# Patient Record
Sex: Female | Born: 1965 | Race: White | Hispanic: No | Marital: Married | Smoking: Never smoker
Health system: Southern US, Community
[De-identification: ages and names within clinical notes are randomized; demographics above are authoritative.]

---

## 2015-07-01 DIAGNOSIS — G4733 Obstructive sleep apnea (adult) (pediatric): Secondary | ICD-10-CM | POA: Diagnosis not present

## 2015-07-09 DIAGNOSIS — G4733 Obstructive sleep apnea (adult) (pediatric): Secondary | ICD-10-CM | POA: Diagnosis not present

## 2015-07-14 DIAGNOSIS — G4700 Insomnia, unspecified: Secondary | ICD-10-CM | POA: Diagnosis not present

## 2015-07-24 DIAGNOSIS — E785 Hyperlipidemia, unspecified: Secondary | ICD-10-CM | POA: Diagnosis not present

## 2015-07-24 DIAGNOSIS — G4730 Sleep apnea, unspecified: Secondary | ICD-10-CM | POA: Diagnosis not present

## 2015-07-31 DIAGNOSIS — G4733 Obstructive sleep apnea (adult) (pediatric): Secondary | ICD-10-CM | POA: Diagnosis not present

## 2015-08-19 DIAGNOSIS — J309 Allergic rhinitis, unspecified: Secondary | ICD-10-CM | POA: Diagnosis not present

## 2015-08-19 DIAGNOSIS — R590 Localized enlarged lymph nodes: Secondary | ICD-10-CM | POA: Diagnosis not present

## 2015-08-19 DIAGNOSIS — R5383 Other fatigue: Secondary | ICD-10-CM | POA: Diagnosis not present

## 2015-08-31 DIAGNOSIS — G4733 Obstructive sleep apnea (adult) (pediatric): Secondary | ICD-10-CM | POA: Diagnosis not present

## 2015-09-01 DIAGNOSIS — G4719 Other hypersomnia: Secondary | ICD-10-CM | POA: Diagnosis not present

## 2015-09-01 DIAGNOSIS — R0981 Nasal congestion: Secondary | ICD-10-CM | POA: Diagnosis not present

## 2015-09-01 DIAGNOSIS — J342 Deviated nasal septum: Secondary | ICD-10-CM | POA: Diagnosis not present

## 2015-09-01 DIAGNOSIS — G4733 Obstructive sleep apnea (adult) (pediatric): Secondary | ICD-10-CM | POA: Diagnosis not present

## 2015-09-02 DIAGNOSIS — F4323 Adjustment disorder with mixed anxiety and depressed mood: Secondary | ICD-10-CM | POA: Diagnosis not present

## 2015-09-02 DIAGNOSIS — Z79899 Other long term (current) drug therapy: Secondary | ICD-10-CM | POA: Diagnosis not present

## 2015-09-02 DIAGNOSIS — F322 Major depressive disorder, single episode, severe without psychotic features: Secondary | ICD-10-CM | POA: Diagnosis not present

## 2015-09-09 DIAGNOSIS — M722 Plantar fascial fibromatosis: Secondary | ICD-10-CM | POA: Diagnosis not present

## 2015-09-09 DIAGNOSIS — M79671 Pain in right foot: Secondary | ICD-10-CM | POA: Diagnosis not present

## 2015-09-30 DIAGNOSIS — G4733 Obstructive sleep apnea (adult) (pediatric): Secondary | ICD-10-CM | POA: Diagnosis not present

## 2015-10-01 DIAGNOSIS — M79675 Pain in left toe(s): Secondary | ICD-10-CM | POA: Diagnosis not present

## 2015-10-01 DIAGNOSIS — L03032 Cellulitis of left toe: Secondary | ICD-10-CM | POA: Diagnosis not present

## 2015-10-01 DIAGNOSIS — L03031 Cellulitis of right toe: Secondary | ICD-10-CM | POA: Diagnosis not present

## 2015-10-01 DIAGNOSIS — M722 Plantar fascial fibromatosis: Secondary | ICD-10-CM | POA: Diagnosis not present

## 2015-10-07 DIAGNOSIS — H33051 Total retinal detachment, right eye: Secondary | ICD-10-CM | POA: Diagnosis not present

## 2015-10-07 DIAGNOSIS — H35371 Puckering of macula, right eye: Secondary | ICD-10-CM | POA: Diagnosis not present

## 2015-10-07 DIAGNOSIS — H3581 Retinal edema: Secondary | ICD-10-CM | POA: Diagnosis not present

## 2015-10-08 DIAGNOSIS — L03032 Cellulitis of left toe: Secondary | ICD-10-CM | POA: Diagnosis not present

## 2015-10-08 DIAGNOSIS — S9001XA Contusion of right ankle, initial encounter: Secondary | ICD-10-CM | POA: Diagnosis not present

## 2015-10-08 DIAGNOSIS — M722 Plantar fascial fibromatosis: Secondary | ICD-10-CM | POA: Diagnosis not present

## 2015-10-08 DIAGNOSIS — L03031 Cellulitis of right toe: Secondary | ICD-10-CM | POA: Diagnosis not present

## 2015-10-13 DIAGNOSIS — G4733 Obstructive sleep apnea (adult) (pediatric): Secondary | ICD-10-CM | POA: Diagnosis not present

## 2015-12-03 DIAGNOSIS — G4733 Obstructive sleep apnea (adult) (pediatric): Secondary | ICD-10-CM | POA: Diagnosis not present

## 2015-12-18 DIAGNOSIS — F4323 Adjustment disorder with mixed anxiety and depressed mood: Secondary | ICD-10-CM | POA: Diagnosis not present

## 2015-12-25 DIAGNOSIS — C50912 Malignant neoplasm of unspecified site of left female breast: Secondary | ICD-10-CM | POA: Diagnosis not present

## 2015-12-25 DIAGNOSIS — Z170 Estrogen receptor positive status [ER+]: Secondary | ICD-10-CM | POA: Diagnosis not present

## 2015-12-25 DIAGNOSIS — R232 Flushing: Secondary | ICD-10-CM | POA: Diagnosis not present

## 2015-12-25 DIAGNOSIS — Z803 Family history of malignant neoplasm of breast: Secondary | ICD-10-CM | POA: Diagnosis not present

## 2015-12-25 DIAGNOSIS — Z79811 Long term (current) use of aromatase inhibitors: Secondary | ICD-10-CM | POA: Diagnosis not present

## 2016-01-06 DIAGNOSIS — F4323 Adjustment disorder with mixed anxiety and depressed mood: Secondary | ICD-10-CM | POA: Diagnosis not present

## 2016-01-15 DIAGNOSIS — F3342 Major depressive disorder, recurrent, in full remission: Secondary | ICD-10-CM | POA: Diagnosis not present

## 2016-01-20 DIAGNOSIS — Z23 Encounter for immunization: Secondary | ICD-10-CM | POA: Diagnosis not present

## 2016-01-29 DIAGNOSIS — F3342 Major depressive disorder, recurrent, in full remission: Secondary | ICD-10-CM | POA: Diagnosis not present

## 2016-03-11 DIAGNOSIS — G4733 Obstructive sleep apnea (adult) (pediatric): Secondary | ICD-10-CM | POA: Diagnosis not present

## 2016-03-19 DIAGNOSIS — Z1151 Encounter for screening for human papillomavirus (HPV): Secondary | ICD-10-CM | POA: Diagnosis not present

## 2016-03-19 DIAGNOSIS — Z124 Encounter for screening for malignant neoplasm of cervix: Secondary | ICD-10-CM | POA: Diagnosis not present

## 2016-03-19 DIAGNOSIS — Z01419 Encounter for gynecological examination (general) (routine) without abnormal findings: Secondary | ICD-10-CM | POA: Diagnosis not present

## 2016-03-26 DIAGNOSIS — L738 Other specified follicular disorders: Secondary | ICD-10-CM | POA: Diagnosis not present

## 2016-03-26 DIAGNOSIS — L700 Acne vulgaris: Secondary | ICD-10-CM | POA: Diagnosis not present

## 2016-03-26 DIAGNOSIS — I781 Nevus, non-neoplastic: Secondary | ICD-10-CM | POA: Diagnosis not present

## 2016-03-31 DIAGNOSIS — J4520 Mild intermittent asthma, uncomplicated: Secondary | ICD-10-CM | POA: Diagnosis not present

## 2016-03-31 DIAGNOSIS — F418 Other specified anxiety disorders: Secondary | ICD-10-CM | POA: Diagnosis not present

## 2016-03-31 DIAGNOSIS — C50911 Malignant neoplasm of unspecified site of right female breast: Secondary | ICD-10-CM | POA: Diagnosis not present

## 2016-03-31 DIAGNOSIS — E7800 Pure hypercholesterolemia, unspecified: Secondary | ICD-10-CM | POA: Diagnosis not present

## 2016-04-01 DIAGNOSIS — E7800 Pure hypercholesterolemia, unspecified: Secondary | ICD-10-CM | POA: Diagnosis not present

## 2016-04-01 DIAGNOSIS — F418 Other specified anxiety disorders: Secondary | ICD-10-CM | POA: Diagnosis not present

## 2016-04-21 DIAGNOSIS — N951 Menopausal and female climacteric states: Secondary | ICD-10-CM | POA: Diagnosis not present

## 2016-04-21 DIAGNOSIS — L989 Disorder of the skin and subcutaneous tissue, unspecified: Secondary | ICD-10-CM | POA: Diagnosis not present

## 2016-04-21 DIAGNOSIS — J3089 Other allergic rhinitis: Secondary | ICD-10-CM | POA: Diagnosis not present

## 2016-04-21 DIAGNOSIS — E7800 Pure hypercholesterolemia, unspecified: Secondary | ICD-10-CM | POA: Diagnosis not present

## 2016-04-22 DIAGNOSIS — L814 Other melanin hyperpigmentation: Secondary | ICD-10-CM | POA: Diagnosis not present

## 2016-04-22 DIAGNOSIS — L821 Other seborrheic keratosis: Secondary | ICD-10-CM | POA: Diagnosis not present

## 2016-04-22 DIAGNOSIS — D225 Melanocytic nevi of trunk: Secondary | ICD-10-CM | POA: Diagnosis not present

## 2016-04-22 DIAGNOSIS — D1801 Hemangioma of skin and subcutaneous tissue: Secondary | ICD-10-CM | POA: Diagnosis not present

## 2016-04-30 DIAGNOSIS — F3342 Major depressive disorder, recurrent, in full remission: Secondary | ICD-10-CM | POA: Diagnosis not present

## 2016-05-05 ENCOUNTER — Ambulatory Visit (INDEPENDENT_AMBULATORY_CARE_PROVIDER_SITE_OTHER): Payer: Self-pay | Admitting: *Deleted

## 2016-05-05 DIAGNOSIS — I8393 Asymptomatic varicose veins of bilateral lower extremities: Secondary | ICD-10-CM

## 2016-05-05 NOTE — Progress Notes (Signed)
   Cutaneous Laser:pulsed mode  810j/cm2 400 ms delay  13 ms Duration 0.5 spot  Total pulses: 643 Total energy 1.019  Total time::08  Tiny red cappillaries scattered over legs. Vessels too small for sclero. Tol well and good local rxn. Anticipate good results. Follow prn.

## 2016-05-12 DIAGNOSIS — H43812 Vitreous degeneration, left eye: Secondary | ICD-10-CM | POA: Diagnosis not present

## 2016-05-12 DIAGNOSIS — H31001 Unspecified chorioretinal scars, right eye: Secondary | ICD-10-CM | POA: Diagnosis not present

## 2016-05-12 DIAGNOSIS — H52203 Unspecified astigmatism, bilateral: Secondary | ICD-10-CM | POA: Diagnosis not present

## 2016-05-12 DIAGNOSIS — H26491 Other secondary cataract, right eye: Secondary | ICD-10-CM | POA: Diagnosis not present

## 2016-05-24 DIAGNOSIS — R05 Cough: Secondary | ICD-10-CM | POA: Diagnosis not present

## 2016-05-24 DIAGNOSIS — J3089 Other allergic rhinitis: Secondary | ICD-10-CM | POA: Diagnosis not present

## 2016-05-31 DIAGNOSIS — J0100 Acute maxillary sinusitis, unspecified: Secondary | ICD-10-CM | POA: Diagnosis not present

## 2016-06-03 DIAGNOSIS — H26491 Other secondary cataract, right eye: Secondary | ICD-10-CM | POA: Diagnosis not present

## 2016-06-07 DIAGNOSIS — R12 Heartburn: Secondary | ICD-10-CM | POA: Diagnosis not present

## 2016-06-07 DIAGNOSIS — N951 Menopausal and female climacteric states: Secondary | ICD-10-CM | POA: Diagnosis not present

## 2016-06-07 DIAGNOSIS — J0100 Acute maxillary sinusitis, unspecified: Secondary | ICD-10-CM | POA: Diagnosis not present

## 2016-06-28 DIAGNOSIS — E7800 Pure hypercholesterolemia, unspecified: Secondary | ICD-10-CM | POA: Diagnosis not present

## 2016-06-28 DIAGNOSIS — R1013 Epigastric pain: Secondary | ICD-10-CM | POA: Diagnosis not present

## 2016-06-28 DIAGNOSIS — Z0000 Encounter for general adult medical examination without abnormal findings: Secondary | ICD-10-CM | POA: Diagnosis not present

## 2016-06-28 DIAGNOSIS — J4520 Mild intermittent asthma, uncomplicated: Secondary | ICD-10-CM | POA: Diagnosis not present

## 2016-07-13 DIAGNOSIS — H35371 Puckering of macula, right eye: Secondary | ICD-10-CM | POA: Diagnosis not present

## 2016-07-13 DIAGNOSIS — H3581 Retinal edema: Secondary | ICD-10-CM | POA: Diagnosis not present

## 2016-07-22 ENCOUNTER — Encounter: Payer: Self-pay | Admitting: *Deleted

## 2016-08-04 ENCOUNTER — Ambulatory Visit: Payer: BLUE CROSS/BLUE SHIELD | Admitting: *Deleted

## 2016-08-04 DIAGNOSIS — I8393 Asymptomatic varicose veins of bilateral lower extremities: Secondary | ICD-10-CM

## 2016-08-04 NOTE — Progress Notes (Signed)
Retreated a few areas with the CLon R thigh that did not close. Good local rxn. This should work. Follow prn. Somers visit.

## 2016-08-05 ENCOUNTER — Encounter: Payer: Self-pay | Admitting: *Deleted

## 2016-09-15 DIAGNOSIS — F3342 Major depressive disorder, recurrent, in full remission: Secondary | ICD-10-CM | POA: Diagnosis not present

## 2016-10-19 DIAGNOSIS — N951 Menopausal and female climacteric states: Secondary | ICD-10-CM | POA: Diagnosis not present

## 2016-10-19 DIAGNOSIS — E7800 Pure hypercholesterolemia, unspecified: Secondary | ICD-10-CM | POA: Diagnosis not present

## 2016-10-25 DIAGNOSIS — G4733 Obstructive sleep apnea (adult) (pediatric): Secondary | ICD-10-CM | POA: Diagnosis not present

## 2016-12-16 DIAGNOSIS — Z90722 Acquired absence of ovaries, bilateral: Secondary | ICD-10-CM | POA: Diagnosis not present

## 2016-12-16 DIAGNOSIS — Z79811 Long term (current) use of aromatase inhibitors: Secondary | ICD-10-CM | POA: Diagnosis not present

## 2016-12-16 DIAGNOSIS — Z791 Long term (current) use of non-steroidal anti-inflammatories (NSAID): Secondary | ICD-10-CM | POA: Diagnosis not present

## 2016-12-16 DIAGNOSIS — Z7951 Long term (current) use of inhaled steroids: Secondary | ICD-10-CM | POA: Diagnosis not present

## 2016-12-16 DIAGNOSIS — Z23 Encounter for immunization: Secondary | ICD-10-CM | POA: Diagnosis not present

## 2016-12-16 DIAGNOSIS — C50919 Malignant neoplasm of unspecified site of unspecified female breast: Secondary | ICD-10-CM | POA: Diagnosis not present

## 2016-12-16 DIAGNOSIS — Z170 Estrogen receptor positive status [ER+]: Secondary | ICD-10-CM | POA: Diagnosis not present

## 2016-12-16 DIAGNOSIS — Z803 Family history of malignant neoplasm of breast: Secondary | ICD-10-CM | POA: Diagnosis not present

## 2016-12-16 DIAGNOSIS — Z9013 Acquired absence of bilateral breasts and nipples: Secondary | ICD-10-CM | POA: Diagnosis not present

## 2016-12-16 DIAGNOSIS — Z9882 Breast implant status: Secondary | ICD-10-CM | POA: Diagnosis not present

## 2016-12-16 DIAGNOSIS — Z79899 Other long term (current) drug therapy: Secondary | ICD-10-CM | POA: Diagnosis not present

## 2016-12-16 DIAGNOSIS — N951 Menopausal and female climacteric states: Secondary | ICD-10-CM | POA: Diagnosis not present

## 2016-12-16 DIAGNOSIS — C50912 Malignant neoplasm of unspecified site of left female breast: Secondary | ICD-10-CM | POA: Diagnosis not present

## 2016-12-16 DIAGNOSIS — Z79818 Long term (current) use of other agents affecting estrogen receptors and estrogen levels: Secondary | ICD-10-CM | POA: Diagnosis not present

## 2016-12-16 DIAGNOSIS — N898 Other specified noninflammatory disorders of vagina: Secondary | ICD-10-CM | POA: Diagnosis not present

## 2016-12-20 DIAGNOSIS — C50911 Malignant neoplasm of unspecified site of right female breast: Secondary | ICD-10-CM | POA: Diagnosis not present

## 2016-12-20 DIAGNOSIS — Z1211 Encounter for screening for malignant neoplasm of colon: Secondary | ICD-10-CM | POA: Diagnosis not present

## 2016-12-20 DIAGNOSIS — E2839 Other primary ovarian failure: Secondary | ICD-10-CM | POA: Diagnosis not present

## 2016-12-20 DIAGNOSIS — J3089 Other allergic rhinitis: Secondary | ICD-10-CM | POA: Diagnosis not present

## 2017-01-11 DIAGNOSIS — Z01818 Encounter for other preprocedural examination: Secondary | ICD-10-CM | POA: Diagnosis not present

## 2017-01-11 DIAGNOSIS — Z1211 Encounter for screening for malignant neoplasm of colon: Secondary | ICD-10-CM | POA: Diagnosis not present

## 2017-01-24 DIAGNOSIS — G4733 Obstructive sleep apnea (adult) (pediatric): Secondary | ICD-10-CM | POA: Diagnosis not present

## 2017-01-31 DIAGNOSIS — M8588 Other specified disorders of bone density and structure, other site: Secondary | ICD-10-CM | POA: Diagnosis not present

## 2017-01-31 DIAGNOSIS — E2839 Other primary ovarian failure: Secondary | ICD-10-CM | POA: Diagnosis not present

## 2017-02-07 DIAGNOSIS — Z1211 Encounter for screening for malignant neoplasm of colon: Secondary | ICD-10-CM | POA: Diagnosis not present

## 2017-02-07 DIAGNOSIS — D126 Benign neoplasm of colon, unspecified: Secondary | ICD-10-CM | POA: Diagnosis not present

## 2017-02-10 DIAGNOSIS — Z1211 Encounter for screening for malignant neoplasm of colon: Secondary | ICD-10-CM | POA: Diagnosis not present

## 2017-02-10 DIAGNOSIS — D126 Benign neoplasm of colon, unspecified: Secondary | ICD-10-CM | POA: Diagnosis not present

## 2017-03-03 DIAGNOSIS — F3342 Major depressive disorder, recurrent, in full remission: Secondary | ICD-10-CM | POA: Diagnosis not present

## 2017-04-01 DIAGNOSIS — R300 Dysuria: Secondary | ICD-10-CM | POA: Diagnosis not present

## 2017-04-01 DIAGNOSIS — N3000 Acute cystitis without hematuria: Secondary | ICD-10-CM | POA: Diagnosis not present

## 2017-04-27 DIAGNOSIS — N390 Urinary tract infection, site not specified: Secondary | ICD-10-CM | POA: Diagnosis not present

## 2017-05-23 DIAGNOSIS — G4733 Obstructive sleep apnea (adult) (pediatric): Secondary | ICD-10-CM | POA: Diagnosis not present

## 2017-06-08 DIAGNOSIS — H52203 Unspecified astigmatism, bilateral: Secondary | ICD-10-CM | POA: Diagnosis not present

## 2017-06-08 DIAGNOSIS — Z961 Presence of intraocular lens: Secondary | ICD-10-CM | POA: Diagnosis not present

## 2017-06-08 DIAGNOSIS — H35371 Puckering of macula, right eye: Secondary | ICD-10-CM | POA: Diagnosis not present

## 2017-06-08 DIAGNOSIS — H31001 Unspecified chorioretinal scars, right eye: Secondary | ICD-10-CM | POA: Diagnosis not present

## 2017-07-05 DIAGNOSIS — C50911 Malignant neoplasm of unspecified site of right female breast: Secondary | ICD-10-CM | POA: Diagnosis not present

## 2017-07-05 DIAGNOSIS — E7800 Pure hypercholesterolemia, unspecified: Secondary | ICD-10-CM | POA: Diagnosis not present

## 2017-07-05 DIAGNOSIS — Z0000 Encounter for general adult medical examination without abnormal findings: Secondary | ICD-10-CM | POA: Diagnosis not present

## 2017-07-05 DIAGNOSIS — F418 Other specified anxiety disorders: Secondary | ICD-10-CM | POA: Diagnosis not present

## 2017-07-05 DIAGNOSIS — Z23 Encounter for immunization: Secondary | ICD-10-CM | POA: Diagnosis not present

## 2017-08-01 DIAGNOSIS — J4520 Mild intermittent asthma, uncomplicated: Secondary | ICD-10-CM | POA: Diagnosis not present

## 2017-08-01 DIAGNOSIS — T781XXA Other adverse food reactions, not elsewhere classified, initial encounter: Secondary | ICD-10-CM | POA: Diagnosis not present

## 2017-08-01 DIAGNOSIS — J3089 Other allergic rhinitis: Secondary | ICD-10-CM | POA: Diagnosis not present

## 2017-08-01 DIAGNOSIS — J3081 Allergic rhinitis due to animal (cat) (dog) hair and dander: Secondary | ICD-10-CM | POA: Diagnosis not present

## 2017-08-04 DIAGNOSIS — R252 Cramp and spasm: Secondary | ICD-10-CM | POA: Diagnosis not present

## 2017-08-04 DIAGNOSIS — N898 Other specified noninflammatory disorders of vagina: Secondary | ICD-10-CM | POA: Diagnosis not present

## 2017-08-08 DIAGNOSIS — G4733 Obstructive sleep apnea (adult) (pediatric): Secondary | ICD-10-CM | POA: Diagnosis not present

## 2017-08-15 DIAGNOSIS — J3089 Other allergic rhinitis: Secondary | ICD-10-CM | POA: Diagnosis not present

## 2017-08-16 DIAGNOSIS — J3081 Allergic rhinitis due to animal (cat) (dog) hair and dander: Secondary | ICD-10-CM | POA: Diagnosis not present

## 2017-08-17 DIAGNOSIS — R300 Dysuria: Secondary | ICD-10-CM | POA: Diagnosis not present

## 2017-08-24 DIAGNOSIS — J3089 Other allergic rhinitis: Secondary | ICD-10-CM | POA: Diagnosis not present

## 2017-08-24 DIAGNOSIS — J301 Allergic rhinitis due to pollen: Secondary | ICD-10-CM | POA: Diagnosis not present

## 2017-08-24 DIAGNOSIS — J3081 Allergic rhinitis due to animal (cat) (dog) hair and dander: Secondary | ICD-10-CM | POA: Diagnosis not present

## 2017-08-26 DIAGNOSIS — J3081 Allergic rhinitis due to animal (cat) (dog) hair and dander: Secondary | ICD-10-CM | POA: Diagnosis not present

## 2017-08-26 DIAGNOSIS — J3089 Other allergic rhinitis: Secondary | ICD-10-CM | POA: Diagnosis not present

## 2017-08-29 DIAGNOSIS — J3089 Other allergic rhinitis: Secondary | ICD-10-CM | POA: Diagnosis not present

## 2017-08-29 DIAGNOSIS — J3081 Allergic rhinitis due to animal (cat) (dog) hair and dander: Secondary | ICD-10-CM | POA: Diagnosis not present

## 2017-08-30 DIAGNOSIS — F3342 Major depressive disorder, recurrent, in full remission: Secondary | ICD-10-CM | POA: Diagnosis not present

## 2017-08-31 DIAGNOSIS — J3089 Other allergic rhinitis: Secondary | ICD-10-CM | POA: Diagnosis not present

## 2017-08-31 DIAGNOSIS — J3081 Allergic rhinitis due to animal (cat) (dog) hair and dander: Secondary | ICD-10-CM | POA: Diagnosis not present

## 2017-09-07 DIAGNOSIS — J3089 Other allergic rhinitis: Secondary | ICD-10-CM | POA: Diagnosis not present

## 2017-09-07 DIAGNOSIS — J3081 Allergic rhinitis due to animal (cat) (dog) hair and dander: Secondary | ICD-10-CM | POA: Diagnosis not present

## 2017-09-07 DIAGNOSIS — J301 Allergic rhinitis due to pollen: Secondary | ICD-10-CM | POA: Diagnosis not present

## 2017-09-09 DIAGNOSIS — J3089 Other allergic rhinitis: Secondary | ICD-10-CM | POA: Diagnosis not present

## 2017-09-09 DIAGNOSIS — J3081 Allergic rhinitis due to animal (cat) (dog) hair and dander: Secondary | ICD-10-CM | POA: Diagnosis not present

## 2017-09-19 DIAGNOSIS — J3089 Other allergic rhinitis: Secondary | ICD-10-CM | POA: Diagnosis not present

## 2017-09-19 DIAGNOSIS — J3081 Allergic rhinitis due to animal (cat) (dog) hair and dander: Secondary | ICD-10-CM | POA: Diagnosis not present

## 2017-09-23 DIAGNOSIS — J3081 Allergic rhinitis due to animal (cat) (dog) hair and dander: Secondary | ICD-10-CM | POA: Diagnosis not present

## 2017-09-23 DIAGNOSIS — J3089 Other allergic rhinitis: Secondary | ICD-10-CM | POA: Diagnosis not present

## 2017-09-26 DIAGNOSIS — J301 Allergic rhinitis due to pollen: Secondary | ICD-10-CM | POA: Diagnosis not present

## 2017-09-26 DIAGNOSIS — J3081 Allergic rhinitis due to animal (cat) (dog) hair and dander: Secondary | ICD-10-CM | POA: Diagnosis not present

## 2017-09-26 DIAGNOSIS — J3089 Other allergic rhinitis: Secondary | ICD-10-CM | POA: Diagnosis not present

## 2017-09-28 DIAGNOSIS — J3089 Other allergic rhinitis: Secondary | ICD-10-CM | POA: Diagnosis not present

## 2017-09-28 DIAGNOSIS — J3081 Allergic rhinitis due to animal (cat) (dog) hair and dander: Secondary | ICD-10-CM | POA: Diagnosis not present

## 2017-10-04 DIAGNOSIS — J3081 Allergic rhinitis due to animal (cat) (dog) hair and dander: Secondary | ICD-10-CM | POA: Diagnosis not present

## 2017-10-04 DIAGNOSIS — J3089 Other allergic rhinitis: Secondary | ICD-10-CM | POA: Diagnosis not present

## 2017-10-07 DIAGNOSIS — J3089 Other allergic rhinitis: Secondary | ICD-10-CM | POA: Diagnosis not present

## 2017-10-07 DIAGNOSIS — J3081 Allergic rhinitis due to animal (cat) (dog) hair and dander: Secondary | ICD-10-CM | POA: Diagnosis not present

## 2017-10-12 DIAGNOSIS — J3081 Allergic rhinitis due to animal (cat) (dog) hair and dander: Secondary | ICD-10-CM | POA: Diagnosis not present

## 2017-10-12 DIAGNOSIS — J3089 Other allergic rhinitis: Secondary | ICD-10-CM | POA: Diagnosis not present

## 2017-10-14 DIAGNOSIS — J3089 Other allergic rhinitis: Secondary | ICD-10-CM | POA: Diagnosis not present

## 2017-10-14 DIAGNOSIS — J3081 Allergic rhinitis due to animal (cat) (dog) hair and dander: Secondary | ICD-10-CM | POA: Diagnosis not present

## 2017-10-19 DIAGNOSIS — J3089 Other allergic rhinitis: Secondary | ICD-10-CM | POA: Diagnosis not present

## 2017-10-19 DIAGNOSIS — J3081 Allergic rhinitis due to animal (cat) (dog) hair and dander: Secondary | ICD-10-CM | POA: Diagnosis not present

## 2017-10-21 DIAGNOSIS — J3089 Other allergic rhinitis: Secondary | ICD-10-CM | POA: Diagnosis not present

## 2017-10-21 DIAGNOSIS — J3081 Allergic rhinitis due to animal (cat) (dog) hair and dander: Secondary | ICD-10-CM | POA: Diagnosis not present

## 2017-10-21 DIAGNOSIS — J301 Allergic rhinitis due to pollen: Secondary | ICD-10-CM | POA: Diagnosis not present

## 2017-10-26 DIAGNOSIS — J3081 Allergic rhinitis due to animal (cat) (dog) hair and dander: Secondary | ICD-10-CM | POA: Diagnosis not present

## 2017-10-26 DIAGNOSIS — J3089 Other allergic rhinitis: Secondary | ICD-10-CM | POA: Diagnosis not present

## 2017-10-28 DIAGNOSIS — J3089 Other allergic rhinitis: Secondary | ICD-10-CM | POA: Diagnosis not present

## 2017-10-28 DIAGNOSIS — J3081 Allergic rhinitis due to animal (cat) (dog) hair and dander: Secondary | ICD-10-CM | POA: Diagnosis not present

## 2017-11-02 DIAGNOSIS — J3089 Other allergic rhinitis: Secondary | ICD-10-CM | POA: Diagnosis not present

## 2017-11-02 DIAGNOSIS — J3081 Allergic rhinitis due to animal (cat) (dog) hair and dander: Secondary | ICD-10-CM | POA: Diagnosis not present

## 2017-11-04 DIAGNOSIS — J3081 Allergic rhinitis due to animal (cat) (dog) hair and dander: Secondary | ICD-10-CM | POA: Diagnosis not present

## 2017-11-04 DIAGNOSIS — J3089 Other allergic rhinitis: Secondary | ICD-10-CM | POA: Diagnosis not present

## 2017-11-07 DIAGNOSIS — J3081 Allergic rhinitis due to animal (cat) (dog) hair and dander: Secondary | ICD-10-CM | POA: Diagnosis not present

## 2017-11-07 DIAGNOSIS — J301 Allergic rhinitis due to pollen: Secondary | ICD-10-CM | POA: Diagnosis not present

## 2017-11-07 DIAGNOSIS — G4733 Obstructive sleep apnea (adult) (pediatric): Secondary | ICD-10-CM | POA: Diagnosis not present

## 2017-11-10 DIAGNOSIS — R252 Cramp and spasm: Secondary | ICD-10-CM | POA: Diagnosis not present

## 2017-11-11 DIAGNOSIS — J3089 Other allergic rhinitis: Secondary | ICD-10-CM | POA: Diagnosis not present

## 2017-11-11 DIAGNOSIS — J3081 Allergic rhinitis due to animal (cat) (dog) hair and dander: Secondary | ICD-10-CM | POA: Diagnosis not present

## 2017-11-16 DIAGNOSIS — J3089 Other allergic rhinitis: Secondary | ICD-10-CM | POA: Diagnosis not present

## 2017-11-16 DIAGNOSIS — J3081 Allergic rhinitis due to animal (cat) (dog) hair and dander: Secondary | ICD-10-CM | POA: Diagnosis not present

## 2017-11-23 DIAGNOSIS — J3081 Allergic rhinitis due to animal (cat) (dog) hair and dander: Secondary | ICD-10-CM | POA: Diagnosis not present

## 2017-11-23 DIAGNOSIS — J3089 Other allergic rhinitis: Secondary | ICD-10-CM | POA: Diagnosis not present

## 2017-11-25 DIAGNOSIS — J3089 Other allergic rhinitis: Secondary | ICD-10-CM | POA: Diagnosis not present

## 2017-11-29 DIAGNOSIS — J3081 Allergic rhinitis due to animal (cat) (dog) hair and dander: Secondary | ICD-10-CM | POA: Diagnosis not present

## 2017-12-01 DIAGNOSIS — L738 Other specified follicular disorders: Secondary | ICD-10-CM | POA: Diagnosis not present

## 2017-12-01 DIAGNOSIS — J3089 Other allergic rhinitis: Secondary | ICD-10-CM | POA: Diagnosis not present

## 2017-12-01 DIAGNOSIS — J3081 Allergic rhinitis due to animal (cat) (dog) hair and dander: Secondary | ICD-10-CM | POA: Diagnosis not present

## 2017-12-01 DIAGNOSIS — L718 Other rosacea: Secondary | ICD-10-CM | POA: Diagnosis not present

## 2017-12-02 DIAGNOSIS — Z853 Personal history of malignant neoplasm of breast: Secondary | ICD-10-CM | POA: Diagnosis not present

## 2017-12-02 DIAGNOSIS — C50919 Malignant neoplasm of unspecified site of unspecified female breast: Secondary | ICD-10-CM | POA: Diagnosis not present

## 2017-12-02 DIAGNOSIS — Z803 Family history of malignant neoplasm of breast: Secondary | ICD-10-CM | POA: Diagnosis not present

## 2017-12-02 DIAGNOSIS — Z170 Estrogen receptor positive status [ER+]: Secondary | ICD-10-CM | POA: Diagnosis not present

## 2017-12-02 DIAGNOSIS — Z6825 Body mass index (BMI) 25.0-25.9, adult: Secondary | ICD-10-CM | POA: Diagnosis not present

## 2017-12-02 DIAGNOSIS — Z79811 Long term (current) use of aromatase inhibitors: Secondary | ICD-10-CM | POA: Diagnosis not present

## 2017-12-05 DIAGNOSIS — J3089 Other allergic rhinitis: Secondary | ICD-10-CM | POA: Diagnosis not present

## 2017-12-05 DIAGNOSIS — J3081 Allergic rhinitis due to animal (cat) (dog) hair and dander: Secondary | ICD-10-CM | POA: Diagnosis not present

## 2017-12-05 DIAGNOSIS — R252 Cramp and spasm: Secondary | ICD-10-CM | POA: Diagnosis not present

## 2017-12-14 DIAGNOSIS — J3089 Other allergic rhinitis: Secondary | ICD-10-CM | POA: Diagnosis not present

## 2017-12-14 DIAGNOSIS — J3081 Allergic rhinitis due to animal (cat) (dog) hair and dander: Secondary | ICD-10-CM | POA: Diagnosis not present

## 2017-12-14 DIAGNOSIS — J301 Allergic rhinitis due to pollen: Secondary | ICD-10-CM | POA: Diagnosis not present

## 2017-12-21 DIAGNOSIS — J3081 Allergic rhinitis due to animal (cat) (dog) hair and dander: Secondary | ICD-10-CM | POA: Diagnosis not present

## 2017-12-21 DIAGNOSIS — J3089 Other allergic rhinitis: Secondary | ICD-10-CM | POA: Diagnosis not present

## 2017-12-26 DIAGNOSIS — R252 Cramp and spasm: Secondary | ICD-10-CM | POA: Diagnosis not present

## 2017-12-28 DIAGNOSIS — J3089 Other allergic rhinitis: Secondary | ICD-10-CM | POA: Diagnosis not present

## 2017-12-28 DIAGNOSIS — J3081 Allergic rhinitis due to animal (cat) (dog) hair and dander: Secondary | ICD-10-CM | POA: Diagnosis not present

## 2018-01-04 DIAGNOSIS — J3081 Allergic rhinitis due to animal (cat) (dog) hair and dander: Secondary | ICD-10-CM | POA: Diagnosis not present

## 2018-01-04 DIAGNOSIS — R252 Cramp and spasm: Secondary | ICD-10-CM | POA: Diagnosis not present

## 2018-01-04 DIAGNOSIS — J3089 Other allergic rhinitis: Secondary | ICD-10-CM | POA: Diagnosis not present

## 2018-01-11 DIAGNOSIS — R252 Cramp and spasm: Secondary | ICD-10-CM | POA: Diagnosis not present

## 2018-01-11 DIAGNOSIS — J301 Allergic rhinitis due to pollen: Secondary | ICD-10-CM | POA: Diagnosis not present

## 2018-01-11 DIAGNOSIS — J3089 Other allergic rhinitis: Secondary | ICD-10-CM | POA: Diagnosis not present

## 2018-01-11 DIAGNOSIS — J3081 Allergic rhinitis due to animal (cat) (dog) hair and dander: Secondary | ICD-10-CM | POA: Diagnosis not present

## 2018-01-18 DIAGNOSIS — J3089 Other allergic rhinitis: Secondary | ICD-10-CM | POA: Diagnosis not present

## 2018-01-18 DIAGNOSIS — J3081 Allergic rhinitis due to animal (cat) (dog) hair and dander: Secondary | ICD-10-CM | POA: Diagnosis not present

## 2018-01-18 DIAGNOSIS — R252 Cramp and spasm: Secondary | ICD-10-CM | POA: Diagnosis not present

## 2018-01-20 DIAGNOSIS — G4733 Obstructive sleep apnea (adult) (pediatric): Secondary | ICD-10-CM | POA: Diagnosis not present

## 2018-01-27 DIAGNOSIS — J3089 Other allergic rhinitis: Secondary | ICD-10-CM | POA: Diagnosis not present

## 2018-01-27 DIAGNOSIS — J3081 Allergic rhinitis due to animal (cat) (dog) hair and dander: Secondary | ICD-10-CM | POA: Diagnosis not present

## 2018-01-30 DIAGNOSIS — R252 Cramp and spasm: Secondary | ICD-10-CM | POA: Diagnosis not present

## 2018-02-01 DIAGNOSIS — J3089 Other allergic rhinitis: Secondary | ICD-10-CM | POA: Diagnosis not present

## 2018-02-01 DIAGNOSIS — J3081 Allergic rhinitis due to animal (cat) (dog) hair and dander: Secondary | ICD-10-CM | POA: Diagnosis not present

## 2018-02-06 DIAGNOSIS — G4733 Obstructive sleep apnea (adult) (pediatric): Secondary | ICD-10-CM | POA: Diagnosis not present

## 2018-02-06 DIAGNOSIS — R252 Cramp and spasm: Secondary | ICD-10-CM | POA: Diagnosis not present

## 2018-02-08 DIAGNOSIS — Z170 Estrogen receptor positive status [ER+]: Secondary | ICD-10-CM | POA: Diagnosis not present

## 2018-02-08 DIAGNOSIS — Z01419 Encounter for gynecological examination (general) (routine) without abnormal findings: Secondary | ICD-10-CM | POA: Diagnosis not present

## 2018-02-08 DIAGNOSIS — J3081 Allergic rhinitis due to animal (cat) (dog) hair and dander: Secondary | ICD-10-CM | POA: Diagnosis not present

## 2018-02-08 DIAGNOSIS — J3089 Other allergic rhinitis: Secondary | ICD-10-CM | POA: Diagnosis not present

## 2018-02-08 DIAGNOSIS — C50912 Malignant neoplasm of unspecified site of left female breast: Secondary | ICD-10-CM | POA: Diagnosis not present

## 2018-02-13 DIAGNOSIS — J3089 Other allergic rhinitis: Secondary | ICD-10-CM | POA: Diagnosis not present

## 2018-02-13 DIAGNOSIS — J3081 Allergic rhinitis due to animal (cat) (dog) hair and dander: Secondary | ICD-10-CM | POA: Diagnosis not present

## 2018-02-13 DIAGNOSIS — J4520 Mild intermittent asthma, uncomplicated: Secondary | ICD-10-CM | POA: Diagnosis not present

## 2018-02-13 DIAGNOSIS — T781XXD Other adverse food reactions, not elsewhere classified, subsequent encounter: Secondary | ICD-10-CM | POA: Diagnosis not present

## 2018-02-14 DIAGNOSIS — R252 Cramp and spasm: Secondary | ICD-10-CM | POA: Diagnosis not present

## 2018-02-15 DIAGNOSIS — J3089 Other allergic rhinitis: Secondary | ICD-10-CM | POA: Diagnosis not present

## 2018-02-15 DIAGNOSIS — J3081 Allergic rhinitis due to animal (cat) (dog) hair and dander: Secondary | ICD-10-CM | POA: Diagnosis not present

## 2018-02-20 DIAGNOSIS — D485 Neoplasm of uncertain behavior of skin: Secondary | ICD-10-CM | POA: Diagnosis not present

## 2018-02-20 DIAGNOSIS — L718 Other rosacea: Secondary | ICD-10-CM | POA: Diagnosis not present

## 2018-02-21 DIAGNOSIS — J3081 Allergic rhinitis due to animal (cat) (dog) hair and dander: Secondary | ICD-10-CM | POA: Diagnosis not present

## 2018-02-21 DIAGNOSIS — J3089 Other allergic rhinitis: Secondary | ICD-10-CM | POA: Diagnosis not present

## 2018-02-27 DIAGNOSIS — F3342 Major depressive disorder, recurrent, in full remission: Secondary | ICD-10-CM | POA: Diagnosis not present

## 2018-02-28 DIAGNOSIS — J3081 Allergic rhinitis due to animal (cat) (dog) hair and dander: Secondary | ICD-10-CM | POA: Diagnosis not present

## 2018-02-28 DIAGNOSIS — J3089 Other allergic rhinitis: Secondary | ICD-10-CM | POA: Diagnosis not present

## 2018-03-07 DIAGNOSIS — B078 Other viral warts: Secondary | ICD-10-CM | POA: Diagnosis not present

## 2018-03-07 DIAGNOSIS — L718 Other rosacea: Secondary | ICD-10-CM | POA: Diagnosis not present

## 2018-03-10 DIAGNOSIS — J3089 Other allergic rhinitis: Secondary | ICD-10-CM | POA: Diagnosis not present

## 2018-03-10 DIAGNOSIS — J3081 Allergic rhinitis due to animal (cat) (dog) hair and dander: Secondary | ICD-10-CM | POA: Diagnosis not present

## 2018-03-14 DIAGNOSIS — R05 Cough: Secondary | ICD-10-CM | POA: Diagnosis not present

## 2018-03-14 DIAGNOSIS — J9801 Acute bronchospasm: Secondary | ICD-10-CM | POA: Diagnosis not present

## 2018-03-14 DIAGNOSIS — J40 Bronchitis, not specified as acute or chronic: Secondary | ICD-10-CM | POA: Diagnosis not present

## 2018-03-17 DIAGNOSIS — J3081 Allergic rhinitis due to animal (cat) (dog) hair and dander: Secondary | ICD-10-CM | POA: Diagnosis not present

## 2018-03-17 DIAGNOSIS — J3089 Other allergic rhinitis: Secondary | ICD-10-CM | POA: Diagnosis not present

## 2018-03-24 DIAGNOSIS — J3089 Other allergic rhinitis: Secondary | ICD-10-CM | POA: Diagnosis not present

## 2018-03-24 DIAGNOSIS — J3081 Allergic rhinitis due to animal (cat) (dog) hair and dander: Secondary | ICD-10-CM | POA: Diagnosis not present

## 2018-03-27 DIAGNOSIS — G4733 Obstructive sleep apnea (adult) (pediatric): Secondary | ICD-10-CM | POA: Diagnosis not present

## 2018-03-28 DIAGNOSIS — J3089 Other allergic rhinitis: Secondary | ICD-10-CM | POA: Diagnosis not present

## 2018-03-28 DIAGNOSIS — J3081 Allergic rhinitis due to animal (cat) (dog) hair and dander: Secondary | ICD-10-CM | POA: Diagnosis not present

## 2018-04-07 DIAGNOSIS — J3089 Other allergic rhinitis: Secondary | ICD-10-CM | POA: Diagnosis not present

## 2018-04-07 DIAGNOSIS — J301 Allergic rhinitis due to pollen: Secondary | ICD-10-CM | POA: Diagnosis not present

## 2018-04-07 DIAGNOSIS — J3081 Allergic rhinitis due to animal (cat) (dog) hair and dander: Secondary | ICD-10-CM | POA: Diagnosis not present

## 2018-04-14 DIAGNOSIS — J3089 Other allergic rhinitis: Secondary | ICD-10-CM | POA: Diagnosis not present

## 2018-04-14 DIAGNOSIS — J3081 Allergic rhinitis due to animal (cat) (dog) hair and dander: Secondary | ICD-10-CM | POA: Diagnosis not present

## 2018-04-18 DIAGNOSIS — J3081 Allergic rhinitis due to animal (cat) (dog) hair and dander: Secondary | ICD-10-CM | POA: Diagnosis not present

## 2018-04-18 DIAGNOSIS — J3089 Other allergic rhinitis: Secondary | ICD-10-CM | POA: Diagnosis not present

## 2018-04-24 DIAGNOSIS — J3081 Allergic rhinitis due to animal (cat) (dog) hair and dander: Secondary | ICD-10-CM | POA: Diagnosis not present

## 2018-04-24 DIAGNOSIS — J3089 Other allergic rhinitis: Secondary | ICD-10-CM | POA: Diagnosis not present

## 2018-04-28 DIAGNOSIS — G4733 Obstructive sleep apnea (adult) (pediatric): Secondary | ICD-10-CM | POA: Diagnosis not present

## 2018-05-03 DIAGNOSIS — J3081 Allergic rhinitis due to animal (cat) (dog) hair and dander: Secondary | ICD-10-CM | POA: Diagnosis not present

## 2018-05-03 DIAGNOSIS — J3089 Other allergic rhinitis: Secondary | ICD-10-CM | POA: Diagnosis not present

## 2018-05-11 DIAGNOSIS — J3081 Allergic rhinitis due to animal (cat) (dog) hair and dander: Secondary | ICD-10-CM | POA: Diagnosis not present

## 2018-05-11 DIAGNOSIS — J3089 Other allergic rhinitis: Secondary | ICD-10-CM | POA: Diagnosis not present

## 2018-05-17 DIAGNOSIS — J3081 Allergic rhinitis due to animal (cat) (dog) hair and dander: Secondary | ICD-10-CM | POA: Diagnosis not present

## 2018-05-17 DIAGNOSIS — J3089 Other allergic rhinitis: Secondary | ICD-10-CM | POA: Diagnosis not present

## 2018-05-18 DIAGNOSIS — J3089 Other allergic rhinitis: Secondary | ICD-10-CM | POA: Diagnosis not present

## 2018-05-19 DIAGNOSIS — J3081 Allergic rhinitis due to animal (cat) (dog) hair and dander: Secondary | ICD-10-CM | POA: Diagnosis not present

## 2018-05-25 ENCOUNTER — Ambulatory Visit
Admission: RE | Admit: 2018-05-25 | Discharge: 2018-05-25 | Disposition: A | Discharge status: Home / Self Care | Payer: BLUE CROSS/BLUE SHIELD | Source: Ambulatory Visit | Attending: Internal Medicine | Admitting: Internal Medicine

## 2018-05-25 ENCOUNTER — Other Ambulatory Visit: Payer: Self-pay | Admitting: Internal Medicine

## 2018-05-25 DIAGNOSIS — R05 Cough: Secondary | ICD-10-CM

## 2018-05-25 DIAGNOSIS — J3089 Other allergic rhinitis: Secondary | ICD-10-CM | POA: Diagnosis not present

## 2018-05-25 DIAGNOSIS — R059 Cough, unspecified: Secondary | ICD-10-CM

## 2018-05-25 DIAGNOSIS — R0781 Pleurodynia: Secondary | ICD-10-CM

## 2018-05-25 DIAGNOSIS — J3081 Allergic rhinitis due to animal (cat) (dog) hair and dander: Secondary | ICD-10-CM | POA: Diagnosis not present

## 2018-05-25 DIAGNOSIS — R21 Rash and other nonspecific skin eruption: Secondary | ICD-10-CM | POA: Diagnosis not present

## 2018-05-31 DIAGNOSIS — J3081 Allergic rhinitis due to animal (cat) (dog) hair and dander: Secondary | ICD-10-CM | POA: Diagnosis not present

## 2018-05-31 DIAGNOSIS — J3089 Other allergic rhinitis: Secondary | ICD-10-CM | POA: Diagnosis not present

## 2018-06-05 DIAGNOSIS — J301 Allergic rhinitis due to pollen: Secondary | ICD-10-CM | POA: Diagnosis not present

## 2018-06-05 DIAGNOSIS — J3081 Allergic rhinitis due to animal (cat) (dog) hair and dander: Secondary | ICD-10-CM | POA: Diagnosis not present

## 2018-06-05 DIAGNOSIS — J3089 Other allergic rhinitis: Secondary | ICD-10-CM | POA: Diagnosis not present

## 2018-06-13 DIAGNOSIS — J3081 Allergic rhinitis due to animal (cat) (dog) hair and dander: Secondary | ICD-10-CM | POA: Diagnosis not present

## 2018-06-13 DIAGNOSIS — J3089 Other allergic rhinitis: Secondary | ICD-10-CM | POA: Diagnosis not present

## 2018-06-26 DIAGNOSIS — J3089 Other allergic rhinitis: Secondary | ICD-10-CM | POA: Diagnosis not present

## 2018-06-26 DIAGNOSIS — J3081 Allergic rhinitis due to animal (cat) (dog) hair and dander: Secondary | ICD-10-CM | POA: Diagnosis not present

## 2018-06-30 DIAGNOSIS — J3081 Allergic rhinitis due to animal (cat) (dog) hair and dander: Secondary | ICD-10-CM | POA: Diagnosis not present

## 2018-06-30 DIAGNOSIS — J3089 Other allergic rhinitis: Secondary | ICD-10-CM | POA: Diagnosis not present

## 2018-07-14 DIAGNOSIS — J3089 Other allergic rhinitis: Secondary | ICD-10-CM | POA: Diagnosis not present

## 2018-07-14 DIAGNOSIS — J3081 Allergic rhinitis due to animal (cat) (dog) hair and dander: Secondary | ICD-10-CM | POA: Diagnosis not present

## 2018-07-21 DIAGNOSIS — J3089 Other allergic rhinitis: Secondary | ICD-10-CM | POA: Diagnosis not present

## 2018-07-21 DIAGNOSIS — J3081 Allergic rhinitis due to animal (cat) (dog) hair and dander: Secondary | ICD-10-CM | POA: Diagnosis not present

## 2018-07-24 DIAGNOSIS — G4733 Obstructive sleep apnea (adult) (pediatric): Secondary | ICD-10-CM | POA: Diagnosis not present

## 2018-07-28 DIAGNOSIS — J3089 Other allergic rhinitis: Secondary | ICD-10-CM | POA: Diagnosis not present

## 2018-07-28 DIAGNOSIS — J3081 Allergic rhinitis due to animal (cat) (dog) hair and dander: Secondary | ICD-10-CM | POA: Diagnosis not present

## 2018-08-04 DIAGNOSIS — J3089 Other allergic rhinitis: Secondary | ICD-10-CM | POA: Diagnosis not present

## 2018-08-04 DIAGNOSIS — J3081 Allergic rhinitis due to animal (cat) (dog) hair and dander: Secondary | ICD-10-CM | POA: Diagnosis not present

## 2018-08-08 DIAGNOSIS — H43812 Vitreous degeneration, left eye: Secondary | ICD-10-CM | POA: Diagnosis not present

## 2018-08-08 DIAGNOSIS — H52203 Unspecified astigmatism, bilateral: Secondary | ICD-10-CM | POA: Diagnosis not present

## 2018-08-08 DIAGNOSIS — H35371 Puckering of macula, right eye: Secondary | ICD-10-CM | POA: Diagnosis not present

## 2018-08-08 DIAGNOSIS — H26492 Other secondary cataract, left eye: Secondary | ICD-10-CM | POA: Diagnosis not present

## 2018-08-11 DIAGNOSIS — J3089 Other allergic rhinitis: Secondary | ICD-10-CM | POA: Diagnosis not present

## 2018-08-11 DIAGNOSIS — J3081 Allergic rhinitis due to animal (cat) (dog) hair and dander: Secondary | ICD-10-CM | POA: Diagnosis not present

## 2018-08-18 DIAGNOSIS — J3089 Other allergic rhinitis: Secondary | ICD-10-CM | POA: Diagnosis not present

## 2018-08-18 DIAGNOSIS — J4520 Mild intermittent asthma, uncomplicated: Secondary | ICD-10-CM | POA: Diagnosis not present

## 2018-08-18 DIAGNOSIS — T781XXD Other adverse food reactions, not elsewhere classified, subsequent encounter: Secondary | ICD-10-CM | POA: Diagnosis not present

## 2018-08-18 DIAGNOSIS — J3081 Allergic rhinitis due to animal (cat) (dog) hair and dander: Secondary | ICD-10-CM | POA: Diagnosis not present

## 2018-08-23 DIAGNOSIS — L570 Actinic keratosis: Secondary | ICD-10-CM | POA: Diagnosis not present

## 2018-08-23 DIAGNOSIS — G4733 Obstructive sleep apnea (adult) (pediatric): Secondary | ICD-10-CM | POA: Diagnosis not present

## 2018-08-23 DIAGNOSIS — L718 Other rosacea: Secondary | ICD-10-CM | POA: Diagnosis not present

## 2018-08-23 DIAGNOSIS — D485 Neoplasm of uncertain behavior of skin: Secondary | ICD-10-CM | POA: Diagnosis not present

## 2018-08-24 DIAGNOSIS — F3342 Major depressive disorder, recurrent, in full remission: Secondary | ICD-10-CM | POA: Diagnosis not present

## 2018-08-25 DIAGNOSIS — J3081 Allergic rhinitis due to animal (cat) (dog) hair and dander: Secondary | ICD-10-CM | POA: Diagnosis not present

## 2018-08-25 DIAGNOSIS — J3089 Other allergic rhinitis: Secondary | ICD-10-CM | POA: Diagnosis not present

## 2018-09-01 DIAGNOSIS — J3089 Other allergic rhinitis: Secondary | ICD-10-CM | POA: Diagnosis not present

## 2018-09-01 DIAGNOSIS — J3081 Allergic rhinitis due to animal (cat) (dog) hair and dander: Secondary | ICD-10-CM | POA: Diagnosis not present

## 2018-09-08 DIAGNOSIS — J3089 Other allergic rhinitis: Secondary | ICD-10-CM | POA: Diagnosis not present

## 2018-09-08 DIAGNOSIS — J3081 Allergic rhinitis due to animal (cat) (dog) hair and dander: Secondary | ICD-10-CM | POA: Diagnosis not present

## 2018-09-08 DIAGNOSIS — J301 Allergic rhinitis due to pollen: Secondary | ICD-10-CM | POA: Diagnosis not present

## 2018-09-12 DIAGNOSIS — H33051 Total retinal detachment, right eye: Secondary | ICD-10-CM | POA: Diagnosis not present

## 2018-09-12 DIAGNOSIS — H35371 Puckering of macula, right eye: Secondary | ICD-10-CM | POA: Diagnosis not present

## 2018-09-15 DIAGNOSIS — J3081 Allergic rhinitis due to animal (cat) (dog) hair and dander: Secondary | ICD-10-CM | POA: Diagnosis not present

## 2018-09-15 DIAGNOSIS — J3089 Other allergic rhinitis: Secondary | ICD-10-CM | POA: Diagnosis not present

## 2018-09-22 DIAGNOSIS — J3089 Other allergic rhinitis: Secondary | ICD-10-CM | POA: Diagnosis not present

## 2018-09-22 DIAGNOSIS — J301 Allergic rhinitis due to pollen: Secondary | ICD-10-CM | POA: Diagnosis not present

## 2018-09-22 DIAGNOSIS — J3081 Allergic rhinitis due to animal (cat) (dog) hair and dander: Secondary | ICD-10-CM | POA: Diagnosis not present

## 2018-09-27 DIAGNOSIS — M25521 Pain in right elbow: Secondary | ICD-10-CM | POA: Diagnosis not present

## 2018-09-27 DIAGNOSIS — R252 Cramp and spasm: Secondary | ICD-10-CM | POA: Diagnosis not present

## 2018-09-27 DIAGNOSIS — M546 Pain in thoracic spine: Secondary | ICD-10-CM | POA: Diagnosis not present

## 2018-09-28 DIAGNOSIS — J3089 Other allergic rhinitis: Secondary | ICD-10-CM | POA: Diagnosis not present

## 2018-09-28 DIAGNOSIS — J3081 Allergic rhinitis due to animal (cat) (dog) hair and dander: Secondary | ICD-10-CM | POA: Diagnosis not present

## 2018-10-04 DIAGNOSIS — R252 Cramp and spasm: Secondary | ICD-10-CM | POA: Diagnosis not present

## 2018-10-04 DIAGNOSIS — M25521 Pain in right elbow: Secondary | ICD-10-CM | POA: Diagnosis not present

## 2018-10-04 DIAGNOSIS — M546 Pain in thoracic spine: Secondary | ICD-10-CM | POA: Diagnosis not present

## 2018-10-06 DIAGNOSIS — J3089 Other allergic rhinitis: Secondary | ICD-10-CM | POA: Diagnosis not present

## 2018-10-06 DIAGNOSIS — J3081 Allergic rhinitis due to animal (cat) (dog) hair and dander: Secondary | ICD-10-CM | POA: Diagnosis not present

## 2018-10-12 DIAGNOSIS — J3081 Allergic rhinitis due to animal (cat) (dog) hair and dander: Secondary | ICD-10-CM | POA: Diagnosis not present

## 2018-10-12 DIAGNOSIS — J3089 Other allergic rhinitis: Secondary | ICD-10-CM | POA: Diagnosis not present

## 2018-10-13 DIAGNOSIS — J3081 Allergic rhinitis due to animal (cat) (dog) hair and dander: Secondary | ICD-10-CM | POA: Diagnosis not present

## 2018-10-13 DIAGNOSIS — J3089 Other allergic rhinitis: Secondary | ICD-10-CM | POA: Diagnosis not present

## 2018-10-19 DIAGNOSIS — H26492 Other secondary cataract, left eye: Secondary | ICD-10-CM | POA: Diagnosis not present

## 2018-10-19 DIAGNOSIS — J3081 Allergic rhinitis due to animal (cat) (dog) hair and dander: Secondary | ICD-10-CM | POA: Diagnosis not present

## 2018-10-19 DIAGNOSIS — J3089 Other allergic rhinitis: Secondary | ICD-10-CM | POA: Diagnosis not present

## 2018-10-20 DIAGNOSIS — M546 Pain in thoracic spine: Secondary | ICD-10-CM | POA: Diagnosis not present

## 2018-10-20 DIAGNOSIS — M25521 Pain in right elbow: Secondary | ICD-10-CM | POA: Diagnosis not present

## 2018-10-20 DIAGNOSIS — R252 Cramp and spasm: Secondary | ICD-10-CM | POA: Diagnosis not present

## 2018-10-23 DIAGNOSIS — G4733 Obstructive sleep apnea (adult) (pediatric): Secondary | ICD-10-CM | POA: Diagnosis not present

## 2018-10-26 DIAGNOSIS — H2000 Unspecified acute and subacute iridocyclitis: Secondary | ICD-10-CM | POA: Diagnosis not present

## 2018-10-26 DIAGNOSIS — H5712 Ocular pain, left eye: Secondary | ICD-10-CM | POA: Diagnosis not present

## 2018-10-27 DIAGNOSIS — J3089 Other allergic rhinitis: Secondary | ICD-10-CM | POA: Diagnosis not present

## 2018-10-27 DIAGNOSIS — J3081 Allergic rhinitis due to animal (cat) (dog) hair and dander: Secondary | ICD-10-CM | POA: Diagnosis not present

## 2018-11-03 DIAGNOSIS — J3089 Other allergic rhinitis: Secondary | ICD-10-CM | POA: Diagnosis not present

## 2018-11-03 DIAGNOSIS — J3081 Allergic rhinitis due to animal (cat) (dog) hair and dander: Secondary | ICD-10-CM | POA: Diagnosis not present

## 2018-11-06 DIAGNOSIS — M25521 Pain in right elbow: Secondary | ICD-10-CM | POA: Diagnosis not present

## 2018-11-06 DIAGNOSIS — M546 Pain in thoracic spine: Secondary | ICD-10-CM | POA: Diagnosis not present

## 2018-11-06 DIAGNOSIS — R252 Cramp and spasm: Secondary | ICD-10-CM | POA: Diagnosis not present

## 2018-11-09 ENCOUNTER — Encounter (HOSPITAL_COMMUNITY): Payer: Self-pay | Admitting: Emergency Medicine

## 2018-11-09 ENCOUNTER — Other Ambulatory Visit: Payer: Self-pay

## 2018-11-09 DIAGNOSIS — R110 Nausea: Secondary | ICD-10-CM | POA: Diagnosis not present

## 2018-11-09 DIAGNOSIS — Z5321 Procedure and treatment not carried out due to patient leaving prior to being seen by health care provider: Secondary | ICD-10-CM | POA: Diagnosis not present

## 2018-11-09 MED ORDER — SODIUM CHLORIDE 0.9% FLUSH: 3.0000 mL | Freq: Once | INTRAVENOUS | Status: DC

## 2018-11-09 NOTE — ED Notes (Signed)
Disregard triage under Benita Stabile

## 2018-11-09 NOTE — ED Triage Notes (Signed)
Patient here from home with complaints of nausea that started today. States that she played tennis this morning and "overdrinked water". I think "Im overhydrated".

## 2018-11-10 ENCOUNTER — Emergency Department (HOSPITAL_COMMUNITY)
Admission: EM | Admit: 2018-11-10 | Discharge: 2018-11-10 | Disposition: A | Discharge status: Home / Self Care | Payer: BC Managed Care – PPO | Attending: Emergency Medicine | Admitting: Emergency Medicine

## 2018-11-10 DIAGNOSIS — J3081 Allergic rhinitis due to animal (cat) (dog) hair and dander: Secondary | ICD-10-CM | POA: Diagnosis not present

## 2018-11-10 DIAGNOSIS — J3089 Other allergic rhinitis: Secondary | ICD-10-CM | POA: Diagnosis not present

## 2018-11-10 LAB — COMPREHENSIVE METABOLIC PANEL
ALT: 34 U/L (ref 0–44)
AST: 30 U/L (ref 15–41)
Albumin: 4 g/dL (ref 3.5–5.0)
Alkaline Phosphatase: 52 U/L (ref 38–126)
Anion gap: 10 (ref 5–15)
BUN: 21 mg/dL — ABNORMAL HIGH (ref 6–20)
CO2: 25 mmol/L (ref 22–32)
Calcium: 8.8 mg/dL — ABNORMAL LOW (ref 8.9–10.3)
Chloride: 91 mmol/L — ABNORMAL LOW (ref 98–111)
Creatinine, Ser: 1.15 mg/dL — ABNORMAL HIGH (ref 0.44–1.00)
GFR calc Af Amer: >60 mL/min (ref >60)
GFR calc non Af Amer: 55 mL/min — ABNORMAL LOW (ref >60)
Glucose, Bld: 119 mg/dL — ABNORMAL HIGH (ref 70–99)
Potassium: 3.9 mmol/L (ref 3.5–5.1)
Sodium: 126 mmol/L — ABNORMAL LOW (ref 135–145)
Total Bilirubin: 0.6 mg/dL (ref 0.3–1.2)
Total Protein: 7.2 g/dL (ref 6.5–8.1)

## 2018-11-10 LAB — URINALYSIS, ROUTINE W REFLEX MICROSCOPIC
Bilirubin Urine: NEGATIVE
Glucose, UA: NEGATIVE mg/dL
Hgb urine dipstick: NEGATIVE
Ketones, ur: NEGATIVE mg/dL
Leukocytes,Ua: NEGATIVE
Nitrite: NEGATIVE
Protein, ur: NEGATIVE mg/dL
Specific Gravity, Urine: 1.009 (ref 1.005–1.030)
pH: 6 (ref 5.0–8.0)

## 2018-11-10 LAB — I-STAT BETA HCG BLOOD, ED (MC, WL, AP ONLY): I-stat hCG, quantitative: <5 m[IU]/mL (ref <5)

## 2018-11-10 LAB — LIPASE, BLOOD: Lipase: 32 U/L (ref 11–51)

## 2018-11-10 LAB — CBC
HCT: 33.9 % — ABNORMAL LOW (ref 36.0–46.0)
Hemoglobin: 11 g/dL — ABNORMAL LOW (ref 12.0–15.0)
MCH: 31.3 pg (ref 26.0–34.0)
MCHC: 32.4 g/dL (ref 30.0–36.0)
MCV: 96.6 fL (ref 80.0–100.0)
Platelets: 270 10*3/uL (ref 150–400)
RBC: 3.51 MIL/uL — ABNORMAL LOW (ref 3.87–5.11)
RDW: 12.2 % (ref 11.5–15.5)
WBC: 9.9 10*3/uL (ref 4.0–10.5)
nRBC: 0 % (ref 0.0–0.2)

## 2018-11-10 NOTE — ED Notes (Signed)
Called for room no answer

## 2018-11-14 DIAGNOSIS — Z0000 Encounter for general adult medical examination without abnormal findings: Secondary | ICD-10-CM | POA: Diagnosis not present

## 2018-11-15 DIAGNOSIS — J3089 Other allergic rhinitis: Secondary | ICD-10-CM | POA: Diagnosis not present

## 2018-11-15 DIAGNOSIS — J3081 Allergic rhinitis due to animal (cat) (dog) hair and dander: Secondary | ICD-10-CM | POA: Diagnosis not present

## 2018-11-15 DIAGNOSIS — J301 Allergic rhinitis due to pollen: Secondary | ICD-10-CM | POA: Diagnosis not present

## 2018-11-24 DIAGNOSIS — J3089 Other allergic rhinitis: Secondary | ICD-10-CM | POA: Diagnosis not present

## 2018-11-24 DIAGNOSIS — J3081 Allergic rhinitis due to animal (cat) (dog) hair and dander: Secondary | ICD-10-CM | POA: Diagnosis not present

## 2018-12-01 DIAGNOSIS — J3089 Other allergic rhinitis: Secondary | ICD-10-CM | POA: Diagnosis not present

## 2018-12-01 DIAGNOSIS — G4733 Obstructive sleep apnea (adult) (pediatric): Secondary | ICD-10-CM | POA: Diagnosis not present

## 2018-12-01 DIAGNOSIS — J3081 Allergic rhinitis due to animal (cat) (dog) hair and dander: Secondary | ICD-10-CM | POA: Diagnosis not present

## 2018-12-07 DIAGNOSIS — J3081 Allergic rhinitis due to animal (cat) (dog) hair and dander: Secondary | ICD-10-CM | POA: Diagnosis not present

## 2018-12-07 DIAGNOSIS — J3089 Other allergic rhinitis: Secondary | ICD-10-CM | POA: Diagnosis not present

## 2018-12-15 DIAGNOSIS — J3081 Allergic rhinitis due to animal (cat) (dog) hair and dander: Secondary | ICD-10-CM | POA: Diagnosis not present

## 2018-12-15 DIAGNOSIS — J3089 Other allergic rhinitis: Secondary | ICD-10-CM | POA: Diagnosis not present

## 2018-12-20 DIAGNOSIS — K130 Diseases of lips: Secondary | ICD-10-CM | POA: Diagnosis not present

## 2018-12-20 DIAGNOSIS — L718 Other rosacea: Secondary | ICD-10-CM | POA: Diagnosis not present

## 2018-12-20 DIAGNOSIS — S80212A Abrasion, left knee, initial encounter: Secondary | ICD-10-CM | POA: Diagnosis not present

## 2018-12-20 DIAGNOSIS — L02223 Furuncle of chest wall: Secondary | ICD-10-CM | POA: Diagnosis not present

## 2018-12-20 DIAGNOSIS — L570 Actinic keratosis: Secondary | ICD-10-CM | POA: Diagnosis not present

## 2018-12-21 DIAGNOSIS — J3081 Allergic rhinitis due to animal (cat) (dog) hair and dander: Secondary | ICD-10-CM | POA: Diagnosis not present

## 2018-12-21 DIAGNOSIS — J3089 Other allergic rhinitis: Secondary | ICD-10-CM | POA: Diagnosis not present

## 2018-12-29 DIAGNOSIS — J3081 Allergic rhinitis due to animal (cat) (dog) hair and dander: Secondary | ICD-10-CM | POA: Diagnosis not present

## 2018-12-29 DIAGNOSIS — J3089 Other allergic rhinitis: Secondary | ICD-10-CM | POA: Diagnosis not present

## 2019-01-05 DIAGNOSIS — J3089 Other allergic rhinitis: Secondary | ICD-10-CM | POA: Diagnosis not present

## 2019-01-05 DIAGNOSIS — J3081 Allergic rhinitis due to animal (cat) (dog) hair and dander: Secondary | ICD-10-CM | POA: Diagnosis not present

## 2019-01-11 DIAGNOSIS — J3089 Other allergic rhinitis: Secondary | ICD-10-CM | POA: Diagnosis not present

## 2019-01-11 DIAGNOSIS — J3081 Allergic rhinitis due to animal (cat) (dog) hair and dander: Secondary | ICD-10-CM | POA: Diagnosis not present

## 2019-01-19 DIAGNOSIS — J3089 Other allergic rhinitis: Secondary | ICD-10-CM | POA: Diagnosis not present

## 2019-01-19 DIAGNOSIS — J3081 Allergic rhinitis due to animal (cat) (dog) hair and dander: Secondary | ICD-10-CM | POA: Diagnosis not present

## 2019-01-22 DIAGNOSIS — E782 Mixed hyperlipidemia: Secondary | ICD-10-CM | POA: Diagnosis not present

## 2019-01-22 DIAGNOSIS — F418 Other specified anxiety disorders: Secondary | ICD-10-CM | POA: Diagnosis not present

## 2019-01-22 DIAGNOSIS — R252 Cramp and spasm: Secondary | ICD-10-CM | POA: Diagnosis not present

## 2019-01-22 DIAGNOSIS — Z0000 Encounter for general adult medical examination without abnormal findings: Secondary | ICD-10-CM | POA: Diagnosis not present

## 2019-01-25 DIAGNOSIS — J3089 Other allergic rhinitis: Secondary | ICD-10-CM | POA: Diagnosis not present

## 2019-01-26 DIAGNOSIS — N1831 Chronic kidney disease, stage 3a: Secondary | ICD-10-CM | POA: Diagnosis not present

## 2019-01-26 DIAGNOSIS — J3089 Other allergic rhinitis: Secondary | ICD-10-CM | POA: Diagnosis not present

## 2019-01-26 DIAGNOSIS — J3081 Allergic rhinitis due to animal (cat) (dog) hair and dander: Secondary | ICD-10-CM | POA: Diagnosis not present

## 2019-01-26 DIAGNOSIS — Z9013 Acquired absence of bilateral breasts and nipples: Secondary | ICD-10-CM | POA: Diagnosis not present

## 2019-01-29 ENCOUNTER — Other Ambulatory Visit: Payer: Self-pay | Admitting: Family Medicine

## 2019-01-29 DIAGNOSIS — E2839 Other primary ovarian failure: Secondary | ICD-10-CM

## 2019-01-31 ENCOUNTER — Other Ambulatory Visit: Payer: Self-pay | Admitting: Family Medicine

## 2019-01-31 DIAGNOSIS — N1831 Chronic kidney disease, stage 3a: Secondary | ICD-10-CM

## 2019-02-01 DIAGNOSIS — J3089 Other allergic rhinitis: Secondary | ICD-10-CM | POA: Diagnosis not present

## 2019-02-01 DIAGNOSIS — J3081 Allergic rhinitis due to animal (cat) (dog) hair and dander: Secondary | ICD-10-CM | POA: Diagnosis not present

## 2019-02-05 ENCOUNTER — Ambulatory Visit
Admission: RE | Admit: 2019-02-05 | Discharge: 2019-02-05 | Disposition: A | Discharge status: Home / Self Care | Payer: BC Managed Care – PPO | Source: Ambulatory Visit | Attending: Family Medicine | Admitting: Family Medicine

## 2019-02-05 ENCOUNTER — Other Ambulatory Visit: Payer: Self-pay

## 2019-02-05 DIAGNOSIS — E2839 Other primary ovarian failure: Secondary | ICD-10-CM

## 2019-02-05 DIAGNOSIS — M85852 Other specified disorders of bone density and structure, left thigh: Secondary | ICD-10-CM | POA: Diagnosis not present

## 2019-02-05 DIAGNOSIS — Z780 Asymptomatic menopausal state: Secondary | ICD-10-CM | POA: Diagnosis not present

## 2019-02-07 DIAGNOSIS — G4733 Obstructive sleep apnea (adult) (pediatric): Secondary | ICD-10-CM | POA: Diagnosis not present

## 2019-02-08 DIAGNOSIS — N1831 Chronic kidney disease, stage 3a: Secondary | ICD-10-CM | POA: Diagnosis not present

## 2019-02-09 DIAGNOSIS — J3089 Other allergic rhinitis: Secondary | ICD-10-CM | POA: Diagnosis not present

## 2019-02-09 DIAGNOSIS — J3081 Allergic rhinitis due to animal (cat) (dog) hair and dander: Secondary | ICD-10-CM | POA: Diagnosis not present

## 2019-02-13 ENCOUNTER — Other Ambulatory Visit: Payer: BC Managed Care – PPO

## 2019-02-14 ENCOUNTER — Ambulatory Visit
Admission: RE | Admit: 2019-02-14 | Discharge: 2019-02-14 | Disposition: A | Discharge status: Home / Self Care | Payer: BC Managed Care – PPO | Source: Ambulatory Visit | Attending: Family Medicine | Admitting: Family Medicine

## 2019-02-14 DIAGNOSIS — N1831 Chronic kidney disease, stage 3a: Secondary | ICD-10-CM

## 2019-02-14 DIAGNOSIS — I129 Hypertensive chronic kidney disease with stage 1 through stage 4 chronic kidney disease, or unspecified chronic kidney disease: Secondary | ICD-10-CM | POA: Diagnosis not present

## 2019-02-16 DIAGNOSIS — J3089 Other allergic rhinitis: Secondary | ICD-10-CM | POA: Diagnosis not present

## 2019-02-16 DIAGNOSIS — J301 Allergic rhinitis due to pollen: Secondary | ICD-10-CM | POA: Diagnosis not present

## 2019-02-20 DIAGNOSIS — J3089 Other allergic rhinitis: Secondary | ICD-10-CM | POA: Diagnosis not present

## 2019-02-20 DIAGNOSIS — J3081 Allergic rhinitis due to animal (cat) (dog) hair and dander: Secondary | ICD-10-CM | POA: Diagnosis not present

## 2019-03-01 DIAGNOSIS — F3342 Major depressive disorder, recurrent, in full remission: Secondary | ICD-10-CM | POA: Diagnosis not present

## 2019-03-02 DIAGNOSIS — J3081 Allergic rhinitis due to animal (cat) (dog) hair and dander: Secondary | ICD-10-CM | POA: Diagnosis not present

## 2019-03-02 DIAGNOSIS — J3089 Other allergic rhinitis: Secondary | ICD-10-CM | POA: Diagnosis not present

## 2019-03-08 DIAGNOSIS — J3089 Other allergic rhinitis: Secondary | ICD-10-CM | POA: Diagnosis not present

## 2019-03-08 DIAGNOSIS — J3081 Allergic rhinitis due to animal (cat) (dog) hair and dander: Secondary | ICD-10-CM | POA: Diagnosis not present

## 2019-03-09 DIAGNOSIS — G4733 Obstructive sleep apnea (adult) (pediatric): Secondary | ICD-10-CM | POA: Diagnosis not present

## 2019-03-12 DIAGNOSIS — Z79899 Other long term (current) drug therapy: Secondary | ICD-10-CM | POA: Diagnosis not present

## 2019-03-12 DIAGNOSIS — M85859 Other specified disorders of bone density and structure, unspecified thigh: Secondary | ICD-10-CM | POA: Diagnosis not present

## 2019-03-12 DIAGNOSIS — Z6822 Body mass index (BMI) 22.0-22.9, adult: Secondary | ICD-10-CM | POA: Diagnosis not present

## 2019-03-12 DIAGNOSIS — Z170 Estrogen receptor positive status [ER+]: Secondary | ICD-10-CM | POA: Diagnosis not present

## 2019-03-12 DIAGNOSIS — N951 Menopausal and female climacteric states: Secondary | ICD-10-CM | POA: Diagnosis not present

## 2019-03-12 DIAGNOSIS — N898 Other specified noninflammatory disorders of vagina: Secondary | ICD-10-CM | POA: Diagnosis not present

## 2019-03-12 DIAGNOSIS — C50919 Malignant neoplasm of unspecified site of unspecified female breast: Secondary | ICD-10-CM | POA: Diagnosis not present

## 2019-03-12 DIAGNOSIS — C50912 Malignant neoplasm of unspecified site of left female breast: Secondary | ICD-10-CM | POA: Diagnosis not present

## 2019-03-12 DIAGNOSIS — Z9013 Acquired absence of bilateral breasts and nipples: Secondary | ICD-10-CM | POA: Diagnosis not present

## 2019-03-14 DIAGNOSIS — J3081 Allergic rhinitis due to animal (cat) (dog) hair and dander: Secondary | ICD-10-CM | POA: Diagnosis not present

## 2019-03-14 DIAGNOSIS — J4520 Mild intermittent asthma, uncomplicated: Secondary | ICD-10-CM | POA: Diagnosis not present

## 2019-03-14 DIAGNOSIS — T781XXD Other adverse food reactions, not elsewhere classified, subsequent encounter: Secondary | ICD-10-CM | POA: Diagnosis not present

## 2019-03-14 DIAGNOSIS — J3089 Other allergic rhinitis: Secondary | ICD-10-CM | POA: Diagnosis not present

## 2019-03-15 DIAGNOSIS — Z01419 Encounter for gynecological examination (general) (routine) without abnormal findings: Secondary | ICD-10-CM | POA: Diagnosis not present

## 2019-03-15 DIAGNOSIS — Z6824 Body mass index (BMI) 24.0-24.9, adult: Secondary | ICD-10-CM | POA: Diagnosis not present

## 2019-03-21 DIAGNOSIS — J3089 Other allergic rhinitis: Secondary | ICD-10-CM | POA: Diagnosis not present

## 2019-03-21 DIAGNOSIS — J3081 Allergic rhinitis due to animal (cat) (dog) hair and dander: Secondary | ICD-10-CM | POA: Diagnosis not present

## 2019-03-28 DIAGNOSIS — J3089 Other allergic rhinitis: Secondary | ICD-10-CM | POA: Diagnosis not present

## 2019-03-28 DIAGNOSIS — J3081 Allergic rhinitis due to animal (cat) (dog) hair and dander: Secondary | ICD-10-CM | POA: Diagnosis not present

## 2019-03-29 DIAGNOSIS — R944 Abnormal results of kidney function studies: Secondary | ICD-10-CM | POA: Diagnosis not present

## 2019-04-05 DIAGNOSIS — J3081 Allergic rhinitis due to animal (cat) (dog) hair and dander: Secondary | ICD-10-CM | POA: Diagnosis not present

## 2019-04-05 DIAGNOSIS — J3089 Other allergic rhinitis: Secondary | ICD-10-CM | POA: Diagnosis not present

## 2019-04-13 DIAGNOSIS — J3089 Other allergic rhinitis: Secondary | ICD-10-CM | POA: Diagnosis not present

## 2019-04-13 DIAGNOSIS — J3081 Allergic rhinitis due to animal (cat) (dog) hair and dander: Secondary | ICD-10-CM | POA: Diagnosis not present

## 2019-04-20 DIAGNOSIS — J3081 Allergic rhinitis due to animal (cat) (dog) hair and dander: Secondary | ICD-10-CM | POA: Diagnosis not present

## 2019-04-20 DIAGNOSIS — J3089 Other allergic rhinitis: Secondary | ICD-10-CM | POA: Diagnosis not present

## 2019-04-26 DIAGNOSIS — J3089 Other allergic rhinitis: Secondary | ICD-10-CM | POA: Diagnosis not present

## 2019-04-26 DIAGNOSIS — J3081 Allergic rhinitis due to animal (cat) (dog) hair and dander: Secondary | ICD-10-CM | POA: Diagnosis not present

## 2019-05-03 DIAGNOSIS — J3081 Allergic rhinitis due to animal (cat) (dog) hair and dander: Secondary | ICD-10-CM | POA: Diagnosis not present

## 2019-05-03 DIAGNOSIS — J301 Allergic rhinitis due to pollen: Secondary | ICD-10-CM | POA: Diagnosis not present

## 2019-05-10 DIAGNOSIS — J3089 Other allergic rhinitis: Secondary | ICD-10-CM | POA: Diagnosis not present

## 2019-05-10 DIAGNOSIS — J3081 Allergic rhinitis due to animal (cat) (dog) hair and dander: Secondary | ICD-10-CM | POA: Diagnosis not present

## 2019-05-16 DIAGNOSIS — J3081 Allergic rhinitis due to animal (cat) (dog) hair and dander: Secondary | ICD-10-CM | POA: Diagnosis not present

## 2019-05-16 DIAGNOSIS — J3089 Other allergic rhinitis: Secondary | ICD-10-CM | POA: Diagnosis not present

## 2019-05-23 DIAGNOSIS — J3081 Allergic rhinitis due to animal (cat) (dog) hair and dander: Secondary | ICD-10-CM | POA: Diagnosis not present

## 2019-05-23 DIAGNOSIS — J3089 Other allergic rhinitis: Secondary | ICD-10-CM | POA: Diagnosis not present

## 2019-05-30 DIAGNOSIS — J3089 Other allergic rhinitis: Secondary | ICD-10-CM | POA: Diagnosis not present

## 2019-05-30 DIAGNOSIS — J3081 Allergic rhinitis due to animal (cat) (dog) hair and dander: Secondary | ICD-10-CM | POA: Diagnosis not present

## 2019-06-07 DIAGNOSIS — J3089 Other allergic rhinitis: Secondary | ICD-10-CM | POA: Diagnosis not present

## 2019-06-07 DIAGNOSIS — G4733 Obstructive sleep apnea (adult) (pediatric): Secondary | ICD-10-CM | POA: Diagnosis not present

## 2019-06-07 DIAGNOSIS — J3081 Allergic rhinitis due to animal (cat) (dog) hair and dander: Secondary | ICD-10-CM | POA: Diagnosis not present

## 2019-06-14 DIAGNOSIS — J3081 Allergic rhinitis due to animal (cat) (dog) hair and dander: Secondary | ICD-10-CM | POA: Diagnosis not present

## 2019-06-14 DIAGNOSIS — J3089 Other allergic rhinitis: Secondary | ICD-10-CM | POA: Diagnosis not present

## 2019-06-20 DIAGNOSIS — J3089 Other allergic rhinitis: Secondary | ICD-10-CM | POA: Diagnosis not present

## 2019-06-20 DIAGNOSIS — J3081 Allergic rhinitis due to animal (cat) (dog) hair and dander: Secondary | ICD-10-CM | POA: Diagnosis not present

## 2019-06-20 DIAGNOSIS — L02426 Furuncle of left lower limb: Secondary | ICD-10-CM | POA: Diagnosis not present

## 2019-06-20 DIAGNOSIS — D485 Neoplasm of uncertain behavior of skin: Secondary | ICD-10-CM | POA: Diagnosis not present

## 2019-06-20 DIAGNOSIS — L0292 Furuncle, unspecified: Secondary | ICD-10-CM | POA: Diagnosis not present

## 2019-06-20 DIAGNOSIS — L02424 Furuncle of left upper limb: Secondary | ICD-10-CM | POA: Diagnosis not present

## 2019-06-20 DIAGNOSIS — L02223 Furuncle of chest wall: Secondary | ICD-10-CM | POA: Diagnosis not present

## 2019-06-22 DIAGNOSIS — J3089 Other allergic rhinitis: Secondary | ICD-10-CM | POA: Diagnosis not present

## 2019-06-25 DIAGNOSIS — J3081 Allergic rhinitis due to animal (cat) (dog) hair and dander: Secondary | ICD-10-CM | POA: Diagnosis not present

## 2019-06-28 DIAGNOSIS — J3089 Other allergic rhinitis: Secondary | ICD-10-CM | POA: Diagnosis not present

## 2019-06-28 DIAGNOSIS — J3081 Allergic rhinitis due to animal (cat) (dog) hair and dander: Secondary | ICD-10-CM | POA: Diagnosis not present

## 2019-07-05 DIAGNOSIS — J3081 Allergic rhinitis due to animal (cat) (dog) hair and dander: Secondary | ICD-10-CM | POA: Diagnosis not present

## 2019-07-05 DIAGNOSIS — J3089 Other allergic rhinitis: Secondary | ICD-10-CM | POA: Diagnosis not present

## 2019-07-11 DIAGNOSIS — J3081 Allergic rhinitis due to animal (cat) (dog) hair and dander: Secondary | ICD-10-CM | POA: Diagnosis not present

## 2019-07-11 DIAGNOSIS — J3089 Other allergic rhinitis: Secondary | ICD-10-CM | POA: Diagnosis not present

## 2019-07-13 DIAGNOSIS — M25521 Pain in right elbow: Secondary | ICD-10-CM | POA: Diagnosis not present

## 2019-07-13 DIAGNOSIS — R252 Cramp and spasm: Secondary | ICD-10-CM | POA: Diagnosis not present

## 2019-07-13 DIAGNOSIS — M546 Pain in thoracic spine: Secondary | ICD-10-CM | POA: Diagnosis not present

## 2019-07-16 DIAGNOSIS — J3089 Other allergic rhinitis: Secondary | ICD-10-CM | POA: Diagnosis not present

## 2019-07-16 DIAGNOSIS — J3081 Allergic rhinitis due to animal (cat) (dog) hair and dander: Secondary | ICD-10-CM | POA: Diagnosis not present

## 2019-07-25 DIAGNOSIS — J3081 Allergic rhinitis due to animal (cat) (dog) hair and dander: Secondary | ICD-10-CM | POA: Diagnosis not present

## 2019-07-25 DIAGNOSIS — M25521 Pain in right elbow: Secondary | ICD-10-CM | POA: Diagnosis not present

## 2019-07-25 DIAGNOSIS — R252 Cramp and spasm: Secondary | ICD-10-CM | POA: Diagnosis not present

## 2019-07-25 DIAGNOSIS — M546 Pain in thoracic spine: Secondary | ICD-10-CM | POA: Diagnosis not present

## 2019-07-25 DIAGNOSIS — J3089 Other allergic rhinitis: Secondary | ICD-10-CM | POA: Diagnosis not present

## 2019-08-01 DIAGNOSIS — L02424 Furuncle of left upper limb: Secondary | ICD-10-CM | POA: Diagnosis not present

## 2019-08-01 DIAGNOSIS — L02223 Furuncle of chest wall: Secondary | ICD-10-CM | POA: Diagnosis not present

## 2019-08-01 DIAGNOSIS — L02425 Furuncle of right lower limb: Secondary | ICD-10-CM | POA: Diagnosis not present

## 2019-08-01 DIAGNOSIS — L02426 Furuncle of left lower limb: Secondary | ICD-10-CM | POA: Diagnosis not present

## 2019-08-02 DIAGNOSIS — J3089 Other allergic rhinitis: Secondary | ICD-10-CM | POA: Diagnosis not present

## 2019-08-02 DIAGNOSIS — J301 Allergic rhinitis due to pollen: Secondary | ICD-10-CM | POA: Diagnosis not present

## 2019-08-03 DIAGNOSIS — R252 Cramp and spasm: Secondary | ICD-10-CM | POA: Diagnosis not present

## 2019-08-03 DIAGNOSIS — M25521 Pain in right elbow: Secondary | ICD-10-CM | POA: Diagnosis not present

## 2019-08-03 DIAGNOSIS — M546 Pain in thoracic spine: Secondary | ICD-10-CM | POA: Diagnosis not present

## 2019-08-09 DIAGNOSIS — J3089 Other allergic rhinitis: Secondary | ICD-10-CM | POA: Diagnosis not present

## 2019-08-09 DIAGNOSIS — J3081 Allergic rhinitis due to animal (cat) (dog) hair and dander: Secondary | ICD-10-CM | POA: Diagnosis not present

## 2019-08-09 DIAGNOSIS — H43812 Vitreous degeneration, left eye: Secondary | ICD-10-CM | POA: Diagnosis not present

## 2019-08-09 DIAGNOSIS — H35371 Puckering of macula, right eye: Secondary | ICD-10-CM | POA: Diagnosis not present

## 2019-08-09 DIAGNOSIS — H31001 Unspecified chorioretinal scars, right eye: Secondary | ICD-10-CM | POA: Diagnosis not present

## 2019-08-09 DIAGNOSIS — H52203 Unspecified astigmatism, bilateral: Secondary | ICD-10-CM | POA: Diagnosis not present

## 2019-08-16 DIAGNOSIS — J3081 Allergic rhinitis due to animal (cat) (dog) hair and dander: Secondary | ICD-10-CM | POA: Diagnosis not present

## 2019-08-16 DIAGNOSIS — J3089 Other allergic rhinitis: Secondary | ICD-10-CM | POA: Diagnosis not present

## 2019-08-17 DIAGNOSIS — M25521 Pain in right elbow: Secondary | ICD-10-CM | POA: Diagnosis not present

## 2019-08-17 DIAGNOSIS — R252 Cramp and spasm: Secondary | ICD-10-CM | POA: Diagnosis not present

## 2019-08-17 DIAGNOSIS — M546 Pain in thoracic spine: Secondary | ICD-10-CM | POA: Diagnosis not present

## 2019-08-23 DIAGNOSIS — J3081 Allergic rhinitis due to animal (cat) (dog) hair and dander: Secondary | ICD-10-CM | POA: Diagnosis not present

## 2019-08-23 DIAGNOSIS — J3089 Other allergic rhinitis: Secondary | ICD-10-CM | POA: Diagnosis not present

## 2019-08-31 DIAGNOSIS — J3081 Allergic rhinitis due to animal (cat) (dog) hair and dander: Secondary | ICD-10-CM | POA: Diagnosis not present

## 2019-08-31 DIAGNOSIS — J3089 Other allergic rhinitis: Secondary | ICD-10-CM | POA: Diagnosis not present

## 2019-09-05 DIAGNOSIS — G4733 Obstructive sleep apnea (adult) (pediatric): Secondary | ICD-10-CM | POA: Diagnosis not present

## 2019-09-06 DIAGNOSIS — J3081 Allergic rhinitis due to animal (cat) (dog) hair and dander: Secondary | ICD-10-CM | POA: Diagnosis not present

## 2019-09-06 DIAGNOSIS — J3089 Other allergic rhinitis: Secondary | ICD-10-CM | POA: Diagnosis not present

## 2019-09-10 DIAGNOSIS — F3342 Major depressive disorder, recurrent, in full remission: Secondary | ICD-10-CM | POA: Diagnosis not present

## 2019-09-13 DIAGNOSIS — J3081 Allergic rhinitis due to animal (cat) (dog) hair and dander: Secondary | ICD-10-CM | POA: Diagnosis not present

## 2019-09-13 DIAGNOSIS — J3089 Other allergic rhinitis: Secondary | ICD-10-CM | POA: Diagnosis not present

## 2019-09-19 DIAGNOSIS — J3089 Other allergic rhinitis: Secondary | ICD-10-CM | POA: Diagnosis not present

## 2019-09-19 DIAGNOSIS — J3081 Allergic rhinitis due to animal (cat) (dog) hair and dander: Secondary | ICD-10-CM | POA: Diagnosis not present

## 2019-09-26 DIAGNOSIS — J301 Allergic rhinitis due to pollen: Secondary | ICD-10-CM | POA: Diagnosis not present

## 2019-09-26 DIAGNOSIS — T781XXD Other adverse food reactions, not elsewhere classified, subsequent encounter: Secondary | ICD-10-CM | POA: Diagnosis not present

## 2019-09-26 DIAGNOSIS — J4520 Mild intermittent asthma, uncomplicated: Secondary | ICD-10-CM | POA: Diagnosis not present

## 2019-09-26 DIAGNOSIS — J3089 Other allergic rhinitis: Secondary | ICD-10-CM | POA: Diagnosis not present

## 2019-09-26 DIAGNOSIS — J3081 Allergic rhinitis due to animal (cat) (dog) hair and dander: Secondary | ICD-10-CM | POA: Diagnosis not present

## 2019-10-04 DIAGNOSIS — J3081 Allergic rhinitis due to animal (cat) (dog) hair and dander: Secondary | ICD-10-CM | POA: Diagnosis not present

## 2019-10-04 DIAGNOSIS — J301 Allergic rhinitis due to pollen: Secondary | ICD-10-CM | POA: Diagnosis not present

## 2019-10-04 DIAGNOSIS — M7711 Lateral epicondylitis, right elbow: Secondary | ICD-10-CM | POA: Diagnosis not present

## 2019-10-04 DIAGNOSIS — M25521 Pain in right elbow: Secondary | ICD-10-CM | POA: Diagnosis not present

## 2019-10-04 DIAGNOSIS — M65331 Trigger finger, right middle finger: Secondary | ICD-10-CM | POA: Diagnosis not present

## 2019-10-04 DIAGNOSIS — J3089 Other allergic rhinitis: Secondary | ICD-10-CM | POA: Diagnosis not present

## 2019-10-04 DIAGNOSIS — M25531 Pain in right wrist: Secondary | ICD-10-CM | POA: Diagnosis not present

## 2019-10-08 DIAGNOSIS — M546 Pain in thoracic spine: Secondary | ICD-10-CM | POA: Diagnosis not present

## 2019-10-08 DIAGNOSIS — R252 Cramp and spasm: Secondary | ICD-10-CM | POA: Diagnosis not present

## 2019-10-08 DIAGNOSIS — M25521 Pain in right elbow: Secondary | ICD-10-CM | POA: Diagnosis not present

## 2019-10-09 DIAGNOSIS — G4733 Obstructive sleep apnea (adult) (pediatric): Secondary | ICD-10-CM | POA: Diagnosis not present

## 2019-10-11 DIAGNOSIS — J3081 Allergic rhinitis due to animal (cat) (dog) hair and dander: Secondary | ICD-10-CM | POA: Diagnosis not present

## 2019-10-11 DIAGNOSIS — J3089 Other allergic rhinitis: Secondary | ICD-10-CM | POA: Diagnosis not present

## 2019-10-11 DIAGNOSIS — J301 Allergic rhinitis due to pollen: Secondary | ICD-10-CM | POA: Diagnosis not present

## 2019-10-11 DIAGNOSIS — M546 Pain in thoracic spine: Secondary | ICD-10-CM | POA: Diagnosis not present

## 2019-10-11 DIAGNOSIS — R252 Cramp and spasm: Secondary | ICD-10-CM | POA: Diagnosis not present

## 2019-10-11 DIAGNOSIS — M25521 Pain in right elbow: Secondary | ICD-10-CM | POA: Diagnosis not present

## 2019-10-18 DIAGNOSIS — J3081 Allergic rhinitis due to animal (cat) (dog) hair and dander: Secondary | ICD-10-CM | POA: Diagnosis not present

## 2019-10-18 DIAGNOSIS — J301 Allergic rhinitis due to pollen: Secondary | ICD-10-CM | POA: Diagnosis not present

## 2019-10-18 DIAGNOSIS — J3089 Other allergic rhinitis: Secondary | ICD-10-CM | POA: Diagnosis not present

## 2019-10-22 DIAGNOSIS — R252 Cramp and spasm: Secondary | ICD-10-CM | POA: Diagnosis not present

## 2019-10-22 DIAGNOSIS — M25521 Pain in right elbow: Secondary | ICD-10-CM | POA: Diagnosis not present

## 2019-10-22 DIAGNOSIS — M546 Pain in thoracic spine: Secondary | ICD-10-CM | POA: Diagnosis not present

## 2019-10-23 DIAGNOSIS — J3081 Allergic rhinitis due to animal (cat) (dog) hair and dander: Secondary | ICD-10-CM | POA: Diagnosis not present

## 2019-10-23 DIAGNOSIS — J3089 Other allergic rhinitis: Secondary | ICD-10-CM | POA: Diagnosis not present

## 2019-10-29 DIAGNOSIS — R252 Cramp and spasm: Secondary | ICD-10-CM | POA: Diagnosis not present

## 2019-10-29 DIAGNOSIS — M25521 Pain in right elbow: Secondary | ICD-10-CM | POA: Diagnosis not present

## 2019-10-29 DIAGNOSIS — M546 Pain in thoracic spine: Secondary | ICD-10-CM | POA: Diagnosis not present

## 2019-11-01 DIAGNOSIS — J3089 Other allergic rhinitis: Secondary | ICD-10-CM | POA: Diagnosis not present

## 2019-11-01 DIAGNOSIS — J3081 Allergic rhinitis due to animal (cat) (dog) hair and dander: Secondary | ICD-10-CM | POA: Diagnosis not present

## 2019-11-08 DIAGNOSIS — J3089 Other allergic rhinitis: Secondary | ICD-10-CM | POA: Diagnosis not present

## 2019-11-08 DIAGNOSIS — J3081 Allergic rhinitis due to animal (cat) (dog) hair and dander: Secondary | ICD-10-CM | POA: Diagnosis not present

## 2019-11-09 DIAGNOSIS — M546 Pain in thoracic spine: Secondary | ICD-10-CM | POA: Diagnosis not present

## 2019-11-09 DIAGNOSIS — R252 Cramp and spasm: Secondary | ICD-10-CM | POA: Diagnosis not present

## 2019-11-09 DIAGNOSIS — M25521 Pain in right elbow: Secondary | ICD-10-CM | POA: Diagnosis not present

## 2019-11-13 DIAGNOSIS — J3081 Allergic rhinitis due to animal (cat) (dog) hair and dander: Secondary | ICD-10-CM | POA: Diagnosis not present

## 2019-11-13 DIAGNOSIS — J3089 Other allergic rhinitis: Secondary | ICD-10-CM | POA: Diagnosis not present

## 2019-11-15 ENCOUNTER — Other Ambulatory Visit: Payer: Self-pay

## 2019-11-15 ENCOUNTER — Ambulatory Visit (INDEPENDENT_AMBULATORY_CARE_PROVIDER_SITE_OTHER): Payer: BC Managed Care – PPO | Admitting: Sports Medicine

## 2019-11-15 VITALS — BP 122/84 | Ht 64.0 in | Wt 143.0 lb

## 2019-11-15 DIAGNOSIS — J3089 Other allergic rhinitis: Secondary | ICD-10-CM | POA: Diagnosis not present

## 2019-11-15 DIAGNOSIS — Y9373 Activity, racquet and hand sports: Secondary | ICD-10-CM

## 2019-11-15 DIAGNOSIS — J301 Allergic rhinitis due to pollen: Secondary | ICD-10-CM | POA: Diagnosis not present

## 2019-11-15 DIAGNOSIS — M7711 Lateral epicondylitis, right elbow: Secondary | ICD-10-CM

## 2019-11-15 DIAGNOSIS — J3081 Allergic rhinitis due to animal (cat) (dog) hair and dander: Secondary | ICD-10-CM | POA: Diagnosis not present

## 2019-11-15 NOTE — Progress Notes (Signed)
Office Visit Note   Patient: Cynthia Pratt           Date of Birth: [birth year]           MRN: [id number] Visit Date: 11/15/2019 Requested by: Kendrick Ranch, MD [address] [place],  [place] [address] PCP: Aliene Beams, MD  Subjective: CC: Right lateral elbow pain.  HPI: Patient is a 54 year old female presenting to clinic with concerns of acute on chronic right lateral elbow pain.  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  She has played tennis off and on throughout her life, and resumed again about 3 years ago.  Over this time she has had waxing and waning concerns about lateral pain in her right elbow.  She has establish care with a physical therapist, who does dry needling as well as counseling and exercises stretching and Iontopatches.  Patient states that she has hired a Engineer, manufacturing systems to help work on her swing technique, and she states that she is compliant with forearm exercises and stretches daily.  She was also given a forearm strap by previous provider, which she states does offer some improvement with wear. Despite her best efforts, however, she continues to be plagued by significant pain in her right elbow.  1 month ago, patient was playing in a state level tournament, where she was expected to perform very well and several matches and had many practices to prepare for this event.  After this tournament, her elbow pain was so severe that she has not been able to return to the tennis pitch.  Patient deeply loves tennis, and is desperate to return to her sport.  She does admit that over the past month of rest her pain has improved, but is concerned that it will return at the moment she picks her racquet back up. She wants to know what else she can do to better prepare herself against a recurrence of her pain.             ROS:   All other systems were reviewed and are negative.  Objective: Vital  Signs: BP 122/84   Ht 5\' 4"  (1.626 m)   Wt 143 lb (64.9 kg)   BMI 24.55 kg/m   Physical Exam:  General:  Alert and oriented, in no acute distress. Pulm:  Breathing unlabored. Psy:  Normal mood, congruent affect. Skin: Right arm with no rashes, bruising, skin lesions. Right arm: Elbows with symmetrical muscle mass.  No swelling or deformity at the elbow joint.  Elbow with full range of motion in flexion and extension, as well as with forearm pronation and supination.  Patient endorses pain with palpation of lateral epicondyle, as well as within the muscle bellies of the extensor compartment.  No pain with palpation of the medial epicondyle or within the flexor musculature. Patient endorses pain with resisted wrist extension, as well as with resisted middle finger extension. No pain with resisted wrist flexion.  Osteopathic examination: Radial head restricted posteriorly.   Imaging: Limited extremity ultrasound-right elbow: Bone spurring appreciated over lateral epicondyle.  Extensor tendons without significant surrounding effusion, or calcification.  No obvious tendon tears or disruption.  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 -Osteopathic examination with radial head in posterior restriction, this was treated with muscle energy.  After adjustment, patient voiced less pain with resisted  wrist extension, as well as resisted middle finger extension. -It is reassuring that she has had some improvement of her pain with rest from tennis.  -Advised patient to continue with rest for 2 more weeks, which would make for a full 6 weeks of restriction from tennis.  After the 6 weeks, she can return to light tennis drills under the instruction of her tennis coach.  -When she returns to play, she is to be very cognizant about her tennis swing, and any specific swings that aggravate her elbow,  where she may be able to improve her form. -Encouraged continued participation with physical therapy, and daily home exercises and stretching. -Encouraged continued adherence to forearm strap, as given by previous provider. -If patient returns to tennis as previously recommended, and continues to experience severe elbow pain, would recommend PRP injections for the lateral epicondyle. -Patient is agreeable with plan, and will follow up in clinic should her symptoms worsen or fail to improve upon return to tennis.    Procedures: Osteopathic manipulation to right elbow: Patient placed in full supination, with provider thumb posterior to radial head.  While patient isometrically pronated, anterior pressure was applied to radial head.  Patient tolerated this very well, and voiced immediate improvement in her pain.   Patient seen and evaluated with the sports medicine fellow.  I agree with the above plan of care.  Limited ultrasound today shows a small spur off the lateral epicondyle but I do not see any significant tearing of the common extensor tendon.  She has done well in the past with physical therapy.  She is currently reenrolled in PT and it is going well.  She has not played tennis in 4 weeks.  I recommended that she wait an additional 2 weeks before resuming tennis and her return should be gradual at that time.  She will continue with her counterforce brace as well as physical therapy.  I did discuss the possibility of PRP injection down the road if needed.  Follow-up as needed.

## 2019-11-16 DIAGNOSIS — M25521 Pain in right elbow: Secondary | ICD-10-CM | POA: Diagnosis not present

## 2019-11-16 DIAGNOSIS — R252 Cramp and spasm: Secondary | ICD-10-CM | POA: Diagnosis not present

## 2019-11-16 DIAGNOSIS — M546 Pain in thoracic spine: Secondary | ICD-10-CM | POA: Diagnosis not present

## 2019-11-21 DIAGNOSIS — J3089 Other allergic rhinitis: Secondary | ICD-10-CM | POA: Diagnosis not present

## 2019-11-21 DIAGNOSIS — J3081 Allergic rhinitis due to animal (cat) (dog) hair and dander: Secondary | ICD-10-CM | POA: Diagnosis not present

## 2019-11-23 DIAGNOSIS — M25521 Pain in right elbow: Secondary | ICD-10-CM | POA: Diagnosis not present

## 2019-11-23 DIAGNOSIS — R252 Cramp and spasm: Secondary | ICD-10-CM | POA: Diagnosis not present

## 2019-11-23 DIAGNOSIS — M546 Pain in thoracic spine: Secondary | ICD-10-CM | POA: Diagnosis not present

## 2019-11-27 DIAGNOSIS — Z0000 Encounter for general adult medical examination without abnormal findings: Secondary | ICD-10-CM | POA: Diagnosis not present

## 2019-11-27 DIAGNOSIS — Z1159 Encounter for screening for other viral diseases: Secondary | ICD-10-CM | POA: Diagnosis not present

## 2019-11-30 DIAGNOSIS — R252 Cramp and spasm: Secondary | ICD-10-CM | POA: Diagnosis not present

## 2019-11-30 DIAGNOSIS — J3089 Other allergic rhinitis: Secondary | ICD-10-CM | POA: Diagnosis not present

## 2019-11-30 DIAGNOSIS — J3081 Allergic rhinitis due to animal (cat) (dog) hair and dander: Secondary | ICD-10-CM | POA: Diagnosis not present

## 2019-11-30 DIAGNOSIS — M546 Pain in thoracic spine: Secondary | ICD-10-CM | POA: Diagnosis not present

## 2019-11-30 DIAGNOSIS — M25521 Pain in right elbow: Secondary | ICD-10-CM | POA: Diagnosis not present

## 2019-12-05 DIAGNOSIS — M25521 Pain in right elbow: Secondary | ICD-10-CM | POA: Diagnosis not present

## 2019-12-05 DIAGNOSIS — J3089 Other allergic rhinitis: Secondary | ICD-10-CM | POA: Diagnosis not present

## 2019-12-05 DIAGNOSIS — M546 Pain in thoracic spine: Secondary | ICD-10-CM | POA: Diagnosis not present

## 2019-12-05 DIAGNOSIS — R252 Cramp and spasm: Secondary | ICD-10-CM | POA: Diagnosis not present

## 2019-12-05 DIAGNOSIS — J3081 Allergic rhinitis due to animal (cat) (dog) hair and dander: Secondary | ICD-10-CM | POA: Diagnosis not present

## 2019-12-05 DIAGNOSIS — J301 Allergic rhinitis due to pollen: Secondary | ICD-10-CM | POA: Diagnosis not present

## 2019-12-07 DIAGNOSIS — M546 Pain in thoracic spine: Secondary | ICD-10-CM | POA: Diagnosis not present

## 2019-12-07 DIAGNOSIS — M25521 Pain in right elbow: Secondary | ICD-10-CM | POA: Diagnosis not present

## 2019-12-07 DIAGNOSIS — R252 Cramp and spasm: Secondary | ICD-10-CM | POA: Diagnosis not present

## 2019-12-14 DIAGNOSIS — M546 Pain in thoracic spine: Secondary | ICD-10-CM | POA: Diagnosis not present

## 2019-12-14 DIAGNOSIS — J3089 Other allergic rhinitis: Secondary | ICD-10-CM | POA: Diagnosis not present

## 2019-12-14 DIAGNOSIS — J3081 Allergic rhinitis due to animal (cat) (dog) hair and dander: Secondary | ICD-10-CM | POA: Diagnosis not present

## 2019-12-14 DIAGNOSIS — R252 Cramp and spasm: Secondary | ICD-10-CM | POA: Diagnosis not present

## 2019-12-14 DIAGNOSIS — M25521 Pain in right elbow: Secondary | ICD-10-CM | POA: Diagnosis not present

## 2019-12-15 DIAGNOSIS — G4733 Obstructive sleep apnea (adult) (pediatric): Secondary | ICD-10-CM | POA: Diagnosis not present

## 2019-12-20 DIAGNOSIS — J3089 Other allergic rhinitis: Secondary | ICD-10-CM | POA: Diagnosis not present

## 2019-12-20 DIAGNOSIS — J3081 Allergic rhinitis due to animal (cat) (dog) hair and dander: Secondary | ICD-10-CM | POA: Diagnosis not present

## 2019-12-28 DIAGNOSIS — M25521 Pain in right elbow: Secondary | ICD-10-CM | POA: Diagnosis not present

## 2019-12-28 DIAGNOSIS — J3089 Other allergic rhinitis: Secondary | ICD-10-CM | POA: Diagnosis not present

## 2019-12-28 DIAGNOSIS — J301 Allergic rhinitis due to pollen: Secondary | ICD-10-CM | POA: Diagnosis not present

## 2019-12-28 DIAGNOSIS — R252 Cramp and spasm: Secondary | ICD-10-CM | POA: Diagnosis not present

## 2019-12-28 DIAGNOSIS — J3081 Allergic rhinitis due to animal (cat) (dog) hair and dander: Secondary | ICD-10-CM | POA: Diagnosis not present

## 2019-12-28 DIAGNOSIS — M546 Pain in thoracic spine: Secondary | ICD-10-CM | POA: Diagnosis not present

## 2020-01-01 DIAGNOSIS — J3089 Other allergic rhinitis: Secondary | ICD-10-CM | POA: Diagnosis not present

## 2020-01-01 DIAGNOSIS — J3081 Allergic rhinitis due to animal (cat) (dog) hair and dander: Secondary | ICD-10-CM | POA: Diagnosis not present

## 2020-01-10 DIAGNOSIS — J301 Allergic rhinitis due to pollen: Secondary | ICD-10-CM | POA: Diagnosis not present

## 2020-01-10 DIAGNOSIS — J3081 Allergic rhinitis due to animal (cat) (dog) hair and dander: Secondary | ICD-10-CM | POA: Diagnosis not present

## 2020-01-10 DIAGNOSIS — J3089 Other allergic rhinitis: Secondary | ICD-10-CM | POA: Diagnosis not present

## 2020-01-18 DIAGNOSIS — R252 Cramp and spasm: Secondary | ICD-10-CM | POA: Diagnosis not present

## 2020-01-18 DIAGNOSIS — M25521 Pain in right elbow: Secondary | ICD-10-CM | POA: Diagnosis not present

## 2020-01-18 DIAGNOSIS — J3089 Other allergic rhinitis: Secondary | ICD-10-CM | POA: Diagnosis not present

## 2020-01-18 DIAGNOSIS — J3081 Allergic rhinitis due to animal (cat) (dog) hair and dander: Secondary | ICD-10-CM | POA: Diagnosis not present

## 2020-01-18 DIAGNOSIS — M546 Pain in thoracic spine: Secondary | ICD-10-CM | POA: Diagnosis not present

## 2020-01-22 DIAGNOSIS — M62838 Other muscle spasm: Secondary | ICD-10-CM | POA: Diagnosis not present

## 2020-01-22 DIAGNOSIS — R079 Chest pain, unspecified: Secondary | ICD-10-CM | POA: Diagnosis not present

## 2020-01-22 DIAGNOSIS — K219 Gastro-esophageal reflux disease without esophagitis: Secondary | ICD-10-CM | POA: Diagnosis not present

## 2020-01-23 DIAGNOSIS — Z170 Estrogen receptor positive status [ER+]: Secondary | ICD-10-CM | POA: Diagnosis not present

## 2020-01-23 DIAGNOSIS — C50919 Malignant neoplasm of unspecified site of unspecified female breast: Secondary | ICD-10-CM | POA: Diagnosis not present

## 2020-01-25 DIAGNOSIS — J3081 Allergic rhinitis due to animal (cat) (dog) hair and dander: Secondary | ICD-10-CM | POA: Diagnosis not present

## 2020-01-25 DIAGNOSIS — J3089 Other allergic rhinitis: Secondary | ICD-10-CM | POA: Diagnosis not present

## 2020-01-31 DIAGNOSIS — J3089 Other allergic rhinitis: Secondary | ICD-10-CM | POA: Diagnosis not present

## 2020-01-31 DIAGNOSIS — J3081 Allergic rhinitis due to animal (cat) (dog) hair and dander: Secondary | ICD-10-CM | POA: Diagnosis not present

## 2020-02-05 DIAGNOSIS — Z6825 Body mass index (BMI) 25.0-25.9, adult: Secondary | ICD-10-CM | POA: Diagnosis not present

## 2020-02-05 DIAGNOSIS — Z01419 Encounter for gynecological examination (general) (routine) without abnormal findings: Secondary | ICD-10-CM | POA: Diagnosis not present

## 2020-02-05 DIAGNOSIS — Z1151 Encounter for screening for human papillomavirus (HPV): Secondary | ICD-10-CM | POA: Diagnosis not present

## 2020-02-05 DIAGNOSIS — L738 Other specified follicular disorders: Secondary | ICD-10-CM | POA: Diagnosis not present

## 2020-02-05 DIAGNOSIS — L718 Other rosacea: Secondary | ICD-10-CM | POA: Diagnosis not present

## 2020-02-05 DIAGNOSIS — Z124 Encounter for screening for malignant neoplasm of cervix: Secondary | ICD-10-CM | POA: Diagnosis not present

## 2020-02-05 DIAGNOSIS — Z7189 Other specified counseling: Secondary | ICD-10-CM | POA: Diagnosis not present

## 2020-02-05 DIAGNOSIS — D485 Neoplasm of uncertain behavior of skin: Secondary | ICD-10-CM | POA: Diagnosis not present

## 2020-02-07 DIAGNOSIS — J3081 Allergic rhinitis due to animal (cat) (dog) hair and dander: Secondary | ICD-10-CM | POA: Diagnosis not present

## 2020-02-07 DIAGNOSIS — J301 Allergic rhinitis due to pollen: Secondary | ICD-10-CM | POA: Diagnosis not present

## 2020-02-07 DIAGNOSIS — G4733 Obstructive sleep apnea (adult) (pediatric): Secondary | ICD-10-CM | POA: Diagnosis not present

## 2020-02-07 DIAGNOSIS — J3089 Other allergic rhinitis: Secondary | ICD-10-CM | POA: Diagnosis not present

## 2020-02-08 DIAGNOSIS — J3081 Allergic rhinitis due to animal (cat) (dog) hair and dander: Secondary | ICD-10-CM | POA: Diagnosis not present

## 2020-02-08 DIAGNOSIS — G4733 Obstructive sleep apnea (adult) (pediatric): Secondary | ICD-10-CM | POA: Diagnosis not present

## 2020-02-15 DIAGNOSIS — J3089 Other allergic rhinitis: Secondary | ICD-10-CM | POA: Diagnosis not present

## 2020-02-15 DIAGNOSIS — J3081 Allergic rhinitis due to animal (cat) (dog) hair and dander: Secondary | ICD-10-CM | POA: Diagnosis not present

## 2020-02-19 DIAGNOSIS — J3081 Allergic rhinitis due to animal (cat) (dog) hair and dander: Secondary | ICD-10-CM | POA: Diagnosis not present

## 2020-02-19 DIAGNOSIS — J3089 Other allergic rhinitis: Secondary | ICD-10-CM | POA: Diagnosis not present

## 2020-02-27 DIAGNOSIS — J301 Allergic rhinitis due to pollen: Secondary | ICD-10-CM | POA: Diagnosis not present

## 2020-02-27 DIAGNOSIS — J3081 Allergic rhinitis due to animal (cat) (dog) hair and dander: Secondary | ICD-10-CM | POA: Diagnosis not present

## 2020-02-27 DIAGNOSIS — J3089 Other allergic rhinitis: Secondary | ICD-10-CM | POA: Diagnosis not present

## 2020-03-03 DIAGNOSIS — L718 Other rosacea: Secondary | ICD-10-CM | POA: Diagnosis not present

## 2020-03-03 DIAGNOSIS — L218 Other seborrheic dermatitis: Secondary | ICD-10-CM | POA: Diagnosis not present

## 2020-03-06 DIAGNOSIS — J3081 Allergic rhinitis due to animal (cat) (dog) hair and dander: Secondary | ICD-10-CM | POA: Diagnosis not present

## 2020-03-06 DIAGNOSIS — J3089 Other allergic rhinitis: Secondary | ICD-10-CM | POA: Diagnosis not present

## 2020-03-14 DIAGNOSIS — J3081 Allergic rhinitis due to animal (cat) (dog) hair and dander: Secondary | ICD-10-CM | POA: Diagnosis not present

## 2020-03-14 DIAGNOSIS — J3089 Other allergic rhinitis: Secondary | ICD-10-CM | POA: Diagnosis not present

## 2020-03-14 DIAGNOSIS — G4733 Obstructive sleep apnea (adult) (pediatric): Secondary | ICD-10-CM | POA: Diagnosis not present

## 2020-03-17 DIAGNOSIS — F3342 Major depressive disorder, recurrent, in full remission: Secondary | ICD-10-CM | POA: Diagnosis not present

## 2020-03-20 DIAGNOSIS — J3081 Allergic rhinitis due to animal (cat) (dog) hair and dander: Secondary | ICD-10-CM | POA: Diagnosis not present

## 2020-03-20 DIAGNOSIS — J3089 Other allergic rhinitis: Secondary | ICD-10-CM | POA: Diagnosis not present

## 2020-03-24 DIAGNOSIS — J3089 Other allergic rhinitis: Secondary | ICD-10-CM | POA: Diagnosis not present

## 2020-03-24 DIAGNOSIS — J3081 Allergic rhinitis due to animal (cat) (dog) hair and dander: Secondary | ICD-10-CM | POA: Diagnosis not present

## 2020-04-01 DIAGNOSIS — J3089 Other allergic rhinitis: Secondary | ICD-10-CM | POA: Diagnosis not present

## 2020-04-01 DIAGNOSIS — J3081 Allergic rhinitis due to animal (cat) (dog) hair and dander: Secondary | ICD-10-CM | POA: Diagnosis not present

## 2020-04-04 DIAGNOSIS — L718 Other rosacea: Secondary | ICD-10-CM | POA: Diagnosis not present

## 2020-04-04 DIAGNOSIS — L2089 Other atopic dermatitis: Secondary | ICD-10-CM | POA: Diagnosis not present

## 2020-04-04 DIAGNOSIS — L218 Other seborrheic dermatitis: Secondary | ICD-10-CM | POA: Diagnosis not present

## 2020-04-09 DIAGNOSIS — J3089 Other allergic rhinitis: Secondary | ICD-10-CM | POA: Diagnosis not present

## 2020-04-09 DIAGNOSIS — J3081 Allergic rhinitis due to animal (cat) (dog) hair and dander: Secondary | ICD-10-CM | POA: Diagnosis not present

## 2020-04-17 DIAGNOSIS — J3081 Allergic rhinitis due to animal (cat) (dog) hair and dander: Secondary | ICD-10-CM | POA: Diagnosis not present

## 2020-04-17 DIAGNOSIS — J3089 Other allergic rhinitis: Secondary | ICD-10-CM | POA: Diagnosis not present

## 2020-04-24 DIAGNOSIS — J3081 Allergic rhinitis due to animal (cat) (dog) hair and dander: Secondary | ICD-10-CM | POA: Diagnosis not present

## 2020-04-24 DIAGNOSIS — J3089 Other allergic rhinitis: Secondary | ICD-10-CM | POA: Diagnosis not present

## 2020-04-24 DIAGNOSIS — J301 Allergic rhinitis due to pollen: Secondary | ICD-10-CM | POA: Diagnosis not present

## 2020-05-01 DIAGNOSIS — J3089 Other allergic rhinitis: Secondary | ICD-10-CM | POA: Diagnosis not present

## 2020-05-01 DIAGNOSIS — J301 Allergic rhinitis due to pollen: Secondary | ICD-10-CM | POA: Diagnosis not present

## 2020-05-01 DIAGNOSIS — J3081 Allergic rhinitis due to animal (cat) (dog) hair and dander: Secondary | ICD-10-CM | POA: Diagnosis not present

## 2020-05-06 DIAGNOSIS — L718 Other rosacea: Secondary | ICD-10-CM | POA: Diagnosis not present

## 2020-05-06 DIAGNOSIS — L2089 Other atopic dermatitis: Secondary | ICD-10-CM | POA: Diagnosis not present

## 2020-05-06 DIAGNOSIS — L218 Other seborrheic dermatitis: Secondary | ICD-10-CM | POA: Diagnosis not present

## 2020-05-08 DIAGNOSIS — H35371 Puckering of macula, right eye: Secondary | ICD-10-CM | POA: Diagnosis not present

## 2020-05-08 DIAGNOSIS — H52203 Unspecified astigmatism, bilateral: Secondary | ICD-10-CM | POA: Diagnosis not present

## 2020-05-08 DIAGNOSIS — H3581 Retinal edema: Secondary | ICD-10-CM | POA: Diagnosis not present

## 2020-05-08 DIAGNOSIS — Z961 Presence of intraocular lens: Secondary | ICD-10-CM | POA: Diagnosis not present

## 2020-05-09 DIAGNOSIS — J3081 Allergic rhinitis due to animal (cat) (dog) hair and dander: Secondary | ICD-10-CM | POA: Diagnosis not present

## 2020-05-09 DIAGNOSIS — J301 Allergic rhinitis due to pollen: Secondary | ICD-10-CM | POA: Diagnosis not present

## 2020-05-09 DIAGNOSIS — J3089 Other allergic rhinitis: Secondary | ICD-10-CM | POA: Diagnosis not present

## 2020-05-12 DIAGNOSIS — J4520 Mild intermittent asthma, uncomplicated: Secondary | ICD-10-CM | POA: Diagnosis not present

## 2020-05-12 DIAGNOSIS — T781XXD Other adverse food reactions, not elsewhere classified, subsequent encounter: Secondary | ICD-10-CM | POA: Diagnosis not present

## 2020-05-12 DIAGNOSIS — J3081 Allergic rhinitis due to animal (cat) (dog) hair and dander: Secondary | ICD-10-CM | POA: Diagnosis not present

## 2020-05-12 DIAGNOSIS — J3089 Other allergic rhinitis: Secondary | ICD-10-CM | POA: Diagnosis not present

## 2020-05-14 DIAGNOSIS — G4733 Obstructive sleep apnea (adult) (pediatric): Secondary | ICD-10-CM | POA: Diagnosis not present

## 2020-05-21 DIAGNOSIS — J3081 Allergic rhinitis due to animal (cat) (dog) hair and dander: Secondary | ICD-10-CM | POA: Diagnosis not present

## 2020-05-21 DIAGNOSIS — J3089 Other allergic rhinitis: Secondary | ICD-10-CM | POA: Diagnosis not present

## 2020-05-26 DIAGNOSIS — J3089 Other allergic rhinitis: Secondary | ICD-10-CM | POA: Diagnosis not present

## 2020-05-26 DIAGNOSIS — J3081 Allergic rhinitis due to animal (cat) (dog) hair and dander: Secondary | ICD-10-CM | POA: Diagnosis not present

## 2020-06-03 DIAGNOSIS — J3089 Other allergic rhinitis: Secondary | ICD-10-CM | POA: Diagnosis not present

## 2020-06-03 DIAGNOSIS — J3081 Allergic rhinitis due to animal (cat) (dog) hair and dander: Secondary | ICD-10-CM | POA: Diagnosis not present

## 2020-06-10 DIAGNOSIS — H33051 Total retinal detachment, right eye: Secondary | ICD-10-CM | POA: Diagnosis not present

## 2020-06-10 DIAGNOSIS — H35371 Puckering of macula, right eye: Secondary | ICD-10-CM | POA: Diagnosis not present

## 2020-06-10 DIAGNOSIS — D239 Other benign neoplasm of skin, unspecified: Secondary | ICD-10-CM | POA: Diagnosis not present

## 2020-06-10 DIAGNOSIS — L718 Other rosacea: Secondary | ICD-10-CM | POA: Diagnosis not present

## 2020-06-10 DIAGNOSIS — H43392 Other vitreous opacities, left eye: Secondary | ICD-10-CM | POA: Diagnosis not present

## 2020-06-10 DIAGNOSIS — L218 Other seborrheic dermatitis: Secondary | ICD-10-CM | POA: Diagnosis not present

## 2020-06-10 DIAGNOSIS — L2089 Other atopic dermatitis: Secondary | ICD-10-CM | POA: Diagnosis not present

## 2020-06-12 DIAGNOSIS — J3089 Other allergic rhinitis: Secondary | ICD-10-CM | POA: Diagnosis not present

## 2020-06-12 DIAGNOSIS — J3081 Allergic rhinitis due to animal (cat) (dog) hair and dander: Secondary | ICD-10-CM | POA: Diagnosis not present

## 2020-06-19 DIAGNOSIS — J3081 Allergic rhinitis due to animal (cat) (dog) hair and dander: Secondary | ICD-10-CM | POA: Diagnosis not present

## 2020-06-19 DIAGNOSIS — J301 Allergic rhinitis due to pollen: Secondary | ICD-10-CM | POA: Diagnosis not present

## 2020-06-19 DIAGNOSIS — J3089 Other allergic rhinitis: Secondary | ICD-10-CM | POA: Diagnosis not present

## 2020-06-26 DIAGNOSIS — J3081 Allergic rhinitis due to animal (cat) (dog) hair and dander: Secondary | ICD-10-CM | POA: Diagnosis not present

## 2020-06-26 DIAGNOSIS — J3089 Other allergic rhinitis: Secondary | ICD-10-CM | POA: Diagnosis not present

## 2020-07-02 DIAGNOSIS — J301 Allergic rhinitis due to pollen: Secondary | ICD-10-CM | POA: Diagnosis not present

## 2020-07-02 DIAGNOSIS — J3081 Allergic rhinitis due to animal (cat) (dog) hair and dander: Secondary | ICD-10-CM | POA: Diagnosis not present

## 2020-07-02 DIAGNOSIS — J3089 Other allergic rhinitis: Secondary | ICD-10-CM | POA: Diagnosis not present

## 2020-07-10 DIAGNOSIS — J3081 Allergic rhinitis due to animal (cat) (dog) hair and dander: Secondary | ICD-10-CM | POA: Diagnosis not present

## 2020-07-10 DIAGNOSIS — J3089 Other allergic rhinitis: Secondary | ICD-10-CM | POA: Diagnosis not present

## 2020-07-15 DIAGNOSIS — L2089 Other atopic dermatitis: Secondary | ICD-10-CM | POA: Diagnosis not present

## 2020-07-15 DIAGNOSIS — L218 Other seborrheic dermatitis: Secondary | ICD-10-CM | POA: Diagnosis not present

## 2020-07-15 DIAGNOSIS — L718 Other rosacea: Secondary | ICD-10-CM | POA: Diagnosis not present

## 2020-07-17 DIAGNOSIS — J3089 Other allergic rhinitis: Secondary | ICD-10-CM | POA: Diagnosis not present

## 2020-07-17 DIAGNOSIS — J3081 Allergic rhinitis due to animal (cat) (dog) hair and dander: Secondary | ICD-10-CM | POA: Diagnosis not present

## 2020-07-22 DIAGNOSIS — G4733 Obstructive sleep apnea (adult) (pediatric): Secondary | ICD-10-CM | POA: Diagnosis not present

## 2020-07-24 DIAGNOSIS — J301 Allergic rhinitis due to pollen: Secondary | ICD-10-CM | POA: Diagnosis not present

## 2020-07-24 DIAGNOSIS — J3089 Other allergic rhinitis: Secondary | ICD-10-CM | POA: Diagnosis not present

## 2020-07-24 DIAGNOSIS — J3081 Allergic rhinitis due to animal (cat) (dog) hair and dander: Secondary | ICD-10-CM | POA: Diagnosis not present

## 2020-07-30 DIAGNOSIS — J3089 Other allergic rhinitis: Secondary | ICD-10-CM | POA: Diagnosis not present

## 2020-07-30 DIAGNOSIS — J3081 Allergic rhinitis due to animal (cat) (dog) hair and dander: Secondary | ICD-10-CM | POA: Diagnosis not present

## 2020-08-06 DIAGNOSIS — J3081 Allergic rhinitis due to animal (cat) (dog) hair and dander: Secondary | ICD-10-CM | POA: Diagnosis not present

## 2020-08-06 DIAGNOSIS — J3089 Other allergic rhinitis: Secondary | ICD-10-CM | POA: Diagnosis not present

## 2020-08-11 DIAGNOSIS — D485 Neoplasm of uncertain behavior of skin: Secondary | ICD-10-CM | POA: Diagnosis not present

## 2020-08-11 DIAGNOSIS — L2089 Other atopic dermatitis: Secondary | ICD-10-CM | POA: Diagnosis not present

## 2020-08-11 DIAGNOSIS — L718 Other rosacea: Secondary | ICD-10-CM | POA: Diagnosis not present

## 2020-08-11 DIAGNOSIS — L738 Other specified follicular disorders: Secondary | ICD-10-CM | POA: Diagnosis not present

## 2020-08-11 DIAGNOSIS — L218 Other seborrheic dermatitis: Secondary | ICD-10-CM | POA: Diagnosis not present

## 2020-08-14 DIAGNOSIS — J3081 Allergic rhinitis due to animal (cat) (dog) hair and dander: Secondary | ICD-10-CM | POA: Diagnosis not present

## 2020-08-14 DIAGNOSIS — J3089 Other allergic rhinitis: Secondary | ICD-10-CM | POA: Diagnosis not present

## 2020-08-18 DIAGNOSIS — J4520 Mild intermittent asthma, uncomplicated: Secondary | ICD-10-CM | POA: Diagnosis not present

## 2020-08-18 DIAGNOSIS — F418 Other specified anxiety disorders: Secondary | ICD-10-CM | POA: Diagnosis not present

## 2020-08-18 DIAGNOSIS — U071 COVID-19: Secondary | ICD-10-CM | POA: Diagnosis not present

## 2020-08-19 DIAGNOSIS — J3081 Allergic rhinitis due to animal (cat) (dog) hair and dander: Secondary | ICD-10-CM | POA: Diagnosis not present

## 2020-08-19 DIAGNOSIS — J3089 Other allergic rhinitis: Secondary | ICD-10-CM | POA: Diagnosis not present

## 2020-09-01 DIAGNOSIS — J3081 Allergic rhinitis due to animal (cat) (dog) hair and dander: Secondary | ICD-10-CM | POA: Diagnosis not present

## 2020-09-01 DIAGNOSIS — J3089 Other allergic rhinitis: Secondary | ICD-10-CM | POA: Diagnosis not present

## 2020-09-04 DIAGNOSIS — G4733 Obstructive sleep apnea (adult) (pediatric): Secondary | ICD-10-CM | POA: Diagnosis not present

## 2020-09-09 DIAGNOSIS — G4733 Obstructive sleep apnea (adult) (pediatric): Secondary | ICD-10-CM | POA: Diagnosis not present

## 2020-09-09 DIAGNOSIS — J3089 Other allergic rhinitis: Secondary | ICD-10-CM | POA: Diagnosis not present

## 2020-09-09 DIAGNOSIS — J3081 Allergic rhinitis due to animal (cat) (dog) hair and dander: Secondary | ICD-10-CM | POA: Diagnosis not present

## 2020-09-17 DIAGNOSIS — J3081 Allergic rhinitis due to animal (cat) (dog) hair and dander: Secondary | ICD-10-CM | POA: Diagnosis not present

## 2020-09-17 DIAGNOSIS — J3089 Other allergic rhinitis: Secondary | ICD-10-CM | POA: Diagnosis not present

## 2020-09-26 DIAGNOSIS — J3089 Other allergic rhinitis: Secondary | ICD-10-CM | POA: Diagnosis not present

## 2020-09-26 DIAGNOSIS — J3081 Allergic rhinitis due to animal (cat) (dog) hair and dander: Secondary | ICD-10-CM | POA: Diagnosis not present

## 2020-10-03 DIAGNOSIS — J3081 Allergic rhinitis due to animal (cat) (dog) hair and dander: Secondary | ICD-10-CM | POA: Diagnosis not present

## 2020-10-03 DIAGNOSIS — J3089 Other allergic rhinitis: Secondary | ICD-10-CM | POA: Diagnosis not present

## 2020-10-09 DIAGNOSIS — J3089 Other allergic rhinitis: Secondary | ICD-10-CM | POA: Diagnosis not present

## 2020-10-09 DIAGNOSIS — J3081 Allergic rhinitis due to animal (cat) (dog) hair and dander: Secondary | ICD-10-CM | POA: Diagnosis not present

## 2020-10-16 DIAGNOSIS — J3081 Allergic rhinitis due to animal (cat) (dog) hair and dander: Secondary | ICD-10-CM | POA: Diagnosis not present

## 2020-10-16 DIAGNOSIS — J301 Allergic rhinitis due to pollen: Secondary | ICD-10-CM | POA: Diagnosis not present

## 2020-10-16 DIAGNOSIS — J3089 Other allergic rhinitis: Secondary | ICD-10-CM | POA: Diagnosis not present

## 2020-10-23 DIAGNOSIS — J3089 Other allergic rhinitis: Secondary | ICD-10-CM | POA: Diagnosis not present

## 2020-10-23 DIAGNOSIS — J3081 Allergic rhinitis due to animal (cat) (dog) hair and dander: Secondary | ICD-10-CM | POA: Diagnosis not present

## 2020-10-30 DIAGNOSIS — J3081 Allergic rhinitis due to animal (cat) (dog) hair and dander: Secondary | ICD-10-CM | POA: Diagnosis not present

## 2020-10-30 DIAGNOSIS — J3089 Other allergic rhinitis: Secondary | ICD-10-CM | POA: Diagnosis not present

## 2020-11-05 DIAGNOSIS — J3081 Allergic rhinitis due to animal (cat) (dog) hair and dander: Secondary | ICD-10-CM | POA: Diagnosis not present

## 2020-11-05 DIAGNOSIS — J3089 Other allergic rhinitis: Secondary | ICD-10-CM | POA: Diagnosis not present

## 2020-11-14 DIAGNOSIS — J3089 Other allergic rhinitis: Secondary | ICD-10-CM | POA: Diagnosis not present

## 2020-11-14 DIAGNOSIS — J3081 Allergic rhinitis due to animal (cat) (dog) hair and dander: Secondary | ICD-10-CM | POA: Diagnosis not present

## 2020-11-21 DIAGNOSIS — J3089 Other allergic rhinitis: Secondary | ICD-10-CM | POA: Diagnosis not present

## 2020-11-21 DIAGNOSIS — J3081 Allergic rhinitis due to animal (cat) (dog) hair and dander: Secondary | ICD-10-CM | POA: Diagnosis not present

## 2020-11-28 DIAGNOSIS — J3081 Allergic rhinitis due to animal (cat) (dog) hair and dander: Secondary | ICD-10-CM | POA: Diagnosis not present

## 2020-11-28 DIAGNOSIS — J3089 Other allergic rhinitis: Secondary | ICD-10-CM | POA: Diagnosis not present

## 2020-12-02 DIAGNOSIS — J3089 Other allergic rhinitis: Secondary | ICD-10-CM | POA: Diagnosis not present

## 2020-12-03 DIAGNOSIS — J3081 Allergic rhinitis due to animal (cat) (dog) hair and dander: Secondary | ICD-10-CM | POA: Diagnosis not present

## 2020-12-05 DIAGNOSIS — Z0000 Encounter for general adult medical examination without abnormal findings: Secondary | ICD-10-CM | POA: Diagnosis not present

## 2020-12-05 DIAGNOSIS — Z23 Encounter for immunization: Secondary | ICD-10-CM | POA: Diagnosis not present

## 2020-12-05 DIAGNOSIS — E7800 Pure hypercholesterolemia, unspecified: Secondary | ICD-10-CM | POA: Diagnosis not present

## 2020-12-05 DIAGNOSIS — F418 Other specified anxiety disorders: Secondary | ICD-10-CM | POA: Diagnosis not present

## 2020-12-05 DIAGNOSIS — J301 Allergic rhinitis due to pollen: Secondary | ICD-10-CM | POA: Diagnosis not present

## 2020-12-05 DIAGNOSIS — J3081 Allergic rhinitis due to animal (cat) (dog) hair and dander: Secondary | ICD-10-CM | POA: Diagnosis not present

## 2020-12-05 DIAGNOSIS — R1013 Epigastric pain: Secondary | ICD-10-CM | POA: Diagnosis not present

## 2020-12-05 DIAGNOSIS — J3089 Other allergic rhinitis: Secondary | ICD-10-CM | POA: Diagnosis not present

## 2020-12-08 DIAGNOSIS — G4733 Obstructive sleep apnea (adult) (pediatric): Secondary | ICD-10-CM | POA: Diagnosis not present

## 2020-12-11 DIAGNOSIS — M25571 Pain in right ankle and joints of right foot: Secondary | ICD-10-CM | POA: Diagnosis not present

## 2020-12-11 DIAGNOSIS — M25572 Pain in left ankle and joints of left foot: Secondary | ICD-10-CM | POA: Diagnosis not present

## 2020-12-12 DIAGNOSIS — J3081 Allergic rhinitis due to animal (cat) (dog) hair and dander: Secondary | ICD-10-CM | POA: Diagnosis not present

## 2020-12-12 DIAGNOSIS — J3089 Other allergic rhinitis: Secondary | ICD-10-CM | POA: Diagnosis not present

## 2020-12-19 DIAGNOSIS — J3081 Allergic rhinitis due to animal (cat) (dog) hair and dander: Secondary | ICD-10-CM | POA: Diagnosis not present

## 2020-12-19 DIAGNOSIS — J3089 Other allergic rhinitis: Secondary | ICD-10-CM | POA: Diagnosis not present

## 2020-12-23 ENCOUNTER — Ambulatory Visit (INDEPENDENT_AMBULATORY_CARE_PROVIDER_SITE_OTHER): Payer: BC Managed Care – PPO | Admitting: Sports Medicine

## 2020-12-23 ENCOUNTER — Encounter: Payer: Self-pay | Admitting: Sports Medicine

## 2020-12-23 DIAGNOSIS — M778 Other enthesopathies, not elsewhere classified: Secondary | ICD-10-CM | POA: Diagnosis not present

## 2020-12-23 DIAGNOSIS — M84374A Stress fracture, right foot, initial encounter for fracture: Secondary | ICD-10-CM

## 2020-12-23 NOTE — Assessment & Plan Note (Signed)
Ultrasound findings consistent with right foot stress reaction over third metatarsal shaft new base of third digit.  Counseled patient that we expect for this to heal with time.  Patient was counseled to rest from playing tennis for 1 additional week.  Patient was in instructed to slowly and progressively increase tennis playing and should stop if she experiences any pain in her foot.  As patient is not limping and states that she has not noticed this prior to examination today, will hold off on using postop boot for now.  Patient is okay to continue in her current shoe wear with current orthotics.  Patient to follow-up as needed or if symptoms worsen.

## 2020-12-23 NOTE — Assessment & Plan Note (Signed)
Ultrasound findings and physical exam consistent with fluid surrounding extensor carpi ulnaris.  Patient counseled to wear compression sleeve while playing tennis and doing activities that require a lot of wrist flexion or ulnar deviation specifically.  Patient in agreement with this plan.  Also counsel to use topical Voltaren gel for pain.  Patient can also continue to ice the wrist. Follow-up as needed or if symptoms worsen

## 2020-12-23 NOTE — Patient Instructions (Addendum)
For your right foot stress reaction: Ultrasound findings were consistent with a stress reaction and not a stress fracture at this time.    We recommend resting from tennis for 1 week.  Following this week of breath, it would be good to slowly and progressively increase your amount of tennis as long as you are not having pain.  If you begin to have worsening pain in your foot, please return to care as you may need a postop boot to wear.   In regards to your wrist pain, we recommend wearing a sleeve for compression while playing tennis.  You can also apply Voltaren gel and use ice if painful years.  We believe this is due to some inflammation around the tendon in your wrist.

## 2020-12-23 NOTE — Progress Notes (Signed)
PCP: Aliene Beams, MD  Subjective:   HPI: Patient is a 55 y.o. female here for right wrist pain and right foot pain.  Right wrist pain Patient reports that she plays tennis often 5 times per week.  She has also been using a Thera bar to help with her tennis elbow.  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  She states that pain is present on the lateral side of her right wrist and is worse with ulnar deviation.  She denies any symptoms of weakness or any paresthesias in her fingers.  Patient states that this pain has been present for 2-3 weeks.  She has worn a brace and states that that helped a little with the pain.  She reports that the discomfort is mild. Patient attributes pain to increased tennis playing, denies recent injury or fall on the wrist, arm or hand.  Right foot pain Patient reports that she has been having right foot pain at the base of her third digit for about 3 weeks.  She states that she has increased amount of playing tennis up to 5 or 6 days/week.  She states that she is also been working on her technique and this involves her playing "on her toes" often.  She reports that she woke up 1 morning over a week ago with swelling over the second and third digits of her right foot.  She denies noticing any limping when she walks.  She reports that she discussed her foot pain with her sister who is a podiatrist who recommended evaluation due to concern for possible stress fracture.  Patient states that she does not take oral pain meds for this.       Objective:  Physical Exam:  Gen: awake, alert, NAD, comfortable in exam room Pulm: breathing unlabored  Right hand and wrist: Inspection: No obvious deformity. No swelling, erythema or bruising Palpation:  TTP along extensor carpi ulnaris ROM: Full ROM of the digits and wrist Strength: 5/5 strength in the wrist and interosseus muscles Neurovascular: NV intact [place]: Cross-section of extensor carpi  ulnaris tendon shows surrounding fluid as well as possible bifid tendon, limited visualization of TFCC but normal appearing   Right foot: Inspection:  No obvious bony deformity.  No swelling, no erythema, or bruising.  Pes planus, collapsed transverse arch  Palpation:  tenderness to palpation at base of third phalange or dorsal aspect of foot, no plantar surface tenderness to palpation ROM: Full  ROM of the ankle. Normal midfoot flexibility Strength: 5/5 strength ankle in all planes Neurovascular: N/V intact distally in the lower extremity Special tests: Negative anterior drawer. [place]: minimal doppler flow visualized on overlying corresponding area of tenderness on dorsal aspect of the right foot concerning for stress reaction, joint space of right third metatarsal appears normal, no bony abnormalities noted   Assessment & Plan:    Stress reaction of right foot Ultrasound findings consistent with right foot stress reaction over third metatarsal shaft new base of third digit.  Counseled patient that we expect for this to heal with time.  Patient was counseled to rest from playing tennis for 1 additional week.  Patient was in instructed to slowly and progressively increase tennis playing and should stop if she experiences any pain in her foot.  As patient is not limping and states that she has not noticed this prior to examination today, will hold off on using postop boot for now.  Patient is okay to continue  in her current shoe wear with current orthotics.  Patient to follow-up as needed or if symptoms worsen.   Extensor carpi ulnaris tendinitis Ultrasound findings and physical exam consistent with fluid surrounding extensor carpi ulnaris.  Patient counseled to wear compression sleeve while playing tennis and doing activities that require a lot of wrist flexion or ulnar deviation specifically.  Patient in agreement with this plan.  Also counsel to use topical Voltaren gel for pain.  Patient can also  continue to ice the wrist. Follow-up as needed or if symptoms worsen    Ronnald Ramp, MD [facility], PGY-3 12/23/2020 3:36 PM  Patient seen and evaluated with the resident.  I agree with the above plan of care.  Ultrasound findings suggest a stress reaction of the third metatarsal of her right foot.  She is already improving with relative rest.  She is not limping.  I think we can forego immobilization but she will need to avoid tennis for about another 3 weeks or so.  I also discussed new custom orthotics with additional cushioning but she just ordered new orthotics recently so her insurance may not cover those.  Ultrasound also shows some fluid around the ECU tendon of her right wrist.  Symptoms are improving.  Watchful waiting approach at this point in time.  She will follow-up for ongoing or recalcitrant issues.

## 2020-12-26 DIAGNOSIS — J3089 Other allergic rhinitis: Secondary | ICD-10-CM | POA: Diagnosis not present

## 2020-12-26 DIAGNOSIS — J3081 Allergic rhinitis due to animal (cat) (dog) hair and dander: Secondary | ICD-10-CM | POA: Diagnosis not present

## 2020-12-30 DIAGNOSIS — M84374A Stress fracture, right foot, initial encounter for fracture: Secondary | ICD-10-CM | POA: Diagnosis not present

## 2021-01-01 DIAGNOSIS — J3081 Allergic rhinitis due to animal (cat) (dog) hair and dander: Secondary | ICD-10-CM | POA: Diagnosis not present

## 2021-01-01 DIAGNOSIS — J3089 Other allergic rhinitis: Secondary | ICD-10-CM | POA: Diagnosis not present

## 2021-01-07 DIAGNOSIS — G4733 Obstructive sleep apnea (adult) (pediatric): Secondary | ICD-10-CM | POA: Diagnosis not present

## 2021-01-08 DIAGNOSIS — J3089 Other allergic rhinitis: Secondary | ICD-10-CM | POA: Diagnosis not present

## 2021-01-08 DIAGNOSIS — J3081 Allergic rhinitis due to animal (cat) (dog) hair and dander: Secondary | ICD-10-CM | POA: Diagnosis not present

## 2021-01-13 ENCOUNTER — Ambulatory Visit (INDEPENDENT_AMBULATORY_CARE_PROVIDER_SITE_OTHER): Payer: BC Managed Care – PPO | Admitting: Sports Medicine

## 2021-01-13 VITALS — BP 130/82 | Ht 64.0 in | Wt 140.0 lb

## 2021-01-13 DIAGNOSIS — M84374D Stress fracture, right foot, subsequent encounter for fracture with routine healing: Secondary | ICD-10-CM

## 2021-01-14 NOTE — Progress Notes (Signed)
   Subjective:    Patient ID: Cynthia Pratt, female    DOB: 26-May-1965, 54 y.o.   MRN: [id number]  HPI  Patient presents today for follow-up on a stress reaction and her right foot.  She did elect to wear a cam walker.  She states that the pain and swelling on the dorsum of her foot have improved.  She still endorses pain on the plantar aspect of the foot near the second metatarsal head.  She denies numbness or tingling into her toes.  No radiating pain.    Review of Systems Above    Objective:   Physical Exam  Well-developed, well-nourished.  Right foot: There is no tenderness to palpation along the dorsum of the foot.  No soft tissue swelling.  Negative metatarsal squeeze.  There is some tenderness to palpation near the second and third metatarsal heads.  Negative Mulder's.  Good pulses.      Assessment & Plan:   Improving right foot pain secondary to metatarsal stress reaction  The pain and swelling on the dorsum of her foot have improved.  Therefore I think her stress reaction is resolving.  I question whether or not she may have some metatarsalgia as well so I have elected to fit a green sports insole with a metatarsal pad.  I would like for her to wear this in her cam walker for 1 additional week and then transition to a regular tennis shoe with the same insole.  Follow-up with me again in 2 weeks.  If symptoms do not continue to improve then consider merits of further diagnostic imaging.  This note was dictated using Dragon naturally speaking software and may contain errors in syntax, spelling, or content which have not been identified prior to signing this note.

## 2021-01-15 DIAGNOSIS — J3081 Allergic rhinitis due to animal (cat) (dog) hair and dander: Secondary | ICD-10-CM | POA: Diagnosis not present

## 2021-01-15 DIAGNOSIS — J3089 Other allergic rhinitis: Secondary | ICD-10-CM | POA: Diagnosis not present

## 2021-01-16 ENCOUNTER — Other Ambulatory Visit: Payer: Self-pay | Admitting: Physician Assistant

## 2021-01-16 DIAGNOSIS — R1013 Epigastric pain: Secondary | ICD-10-CM | POA: Diagnosis not present

## 2021-01-16 DIAGNOSIS — K219 Gastro-esophageal reflux disease without esophagitis: Secondary | ICD-10-CM

## 2021-01-22 ENCOUNTER — Ambulatory Visit
Admission: RE | Admit: 2021-01-22 | Discharge: 2021-01-22 | Disposition: A | Discharge status: Home / Self Care | Payer: BC Managed Care – PPO | Source: Ambulatory Visit | Attending: Physician Assistant | Admitting: Physician Assistant

## 2021-01-22 ENCOUNTER — Other Ambulatory Visit: Payer: Self-pay

## 2021-01-22 DIAGNOSIS — K219 Gastro-esophageal reflux disease without esophagitis: Secondary | ICD-10-CM

## 2021-01-22 DIAGNOSIS — R1013 Epigastric pain: Secondary | ICD-10-CM

## 2021-01-23 DIAGNOSIS — J301 Allergic rhinitis due to pollen: Secondary | ICD-10-CM | POA: Diagnosis not present

## 2021-01-23 DIAGNOSIS — J3089 Other allergic rhinitis: Secondary | ICD-10-CM | POA: Diagnosis not present

## 2021-01-23 DIAGNOSIS — J3081 Allergic rhinitis due to animal (cat) (dog) hair and dander: Secondary | ICD-10-CM | POA: Diagnosis not present

## 2021-01-29 ENCOUNTER — Ambulatory Visit
Admission: RE | Admit: 2021-01-29 | Discharge: 2021-01-29 | Disposition: A | Discharge status: Home / Self Care | Payer: BC Managed Care – PPO | Source: Ambulatory Visit | Attending: Sports Medicine | Admitting: Sports Medicine

## 2021-01-29 ENCOUNTER — Ambulatory Visit (INDEPENDENT_AMBULATORY_CARE_PROVIDER_SITE_OTHER): Payer: BC Managed Care – PPO | Admitting: Sports Medicine

## 2021-01-29 ENCOUNTER — Other Ambulatory Visit: Payer: Self-pay

## 2021-01-29 VITALS — BP 118/78 | Ht 64.0 in | Wt 145.0 lb

## 2021-01-29 DIAGNOSIS — M79671 Pain in right foot: Secondary | ICD-10-CM | POA: Diagnosis not present

## 2021-01-29 NOTE — Progress Notes (Signed)
   Subjective:    Patient ID: Cynthia Pratt, female    DOB: 21-Feb-1966, 55 y.o.   MRN: [id number]  HPI  Cynthia Pratt presents today for follow-up on her right foot pain.  Overall she thinks that her pain has improved but she is not sure.  She feels like she has been protecting the foot for several weeks.  She has transitioned out of her cam walker and has a very comfortable orthotic with a metatarsal pad in it.  The discomfort she is feeling is still in the dorsal aspect of the second and third metatarsals.  She continues to deny numbness and tingling into her toes.   Review of Systems As above    Objective:   Physical Exam  Well-developed, well-nourished.  No acute distress  Right foot: There is no swelling or tenderness to palpation on the dorsum of the foot.  There is still some slight tenderness to palpation on the plantar aspect of the foot at the second and third metatarsal heads.  No palpable Morton's neuroma.  Transverse arch collapse with standing.  Good pulses.  She is ambulating with a bit of a limp.  X-rays of the right foot including AP, lateral, and oblique views are fairly unremarkable.  She has some mild spurring in the first MTP joint but otherwise normal.      Assessment & Plan:   Right foot pain likely secondary to metatarsalgia Resolved metatarsal stress reaction  Patient will start to increase her activity as tolerated using her orthotics and metatarsal pads.  I would look for her symptoms to continue to improve.  If not, she will notify me and I will consider merits of further diagnostic imaging.  Follow-up for ongoing or recalcitrant issues.  This note was dictated using Dragon naturally speaking software and may contain errors in syntax, spelling, or content which have not been identified prior to signing this note.

## 2021-01-30 DIAGNOSIS — J3089 Other allergic rhinitis: Secondary | ICD-10-CM | POA: Diagnosis not present

## 2021-01-30 DIAGNOSIS — J3081 Allergic rhinitis due to animal (cat) (dog) hair and dander: Secondary | ICD-10-CM | POA: Diagnosis not present

## 2021-02-05 DIAGNOSIS — L718 Other rosacea: Secondary | ICD-10-CM | POA: Diagnosis not present

## 2021-02-05 DIAGNOSIS — L218 Other seborrheic dermatitis: Secondary | ICD-10-CM | POA: Diagnosis not present

## 2021-02-05 DIAGNOSIS — D485 Neoplasm of uncertain behavior of skin: Secondary | ICD-10-CM | POA: Diagnosis not present

## 2021-02-05 DIAGNOSIS — Z6825 Body mass index (BMI) 25.0-25.9, adult: Secondary | ICD-10-CM | POA: Diagnosis not present

## 2021-02-05 DIAGNOSIS — Z01419 Encounter for gynecological examination (general) (routine) without abnormal findings: Secondary | ICD-10-CM | POA: Diagnosis not present

## 2021-02-05 DIAGNOSIS — L2089 Other atopic dermatitis: Secondary | ICD-10-CM | POA: Diagnosis not present

## 2021-02-06 DIAGNOSIS — J3081 Allergic rhinitis due to animal (cat) (dog) hair and dander: Secondary | ICD-10-CM | POA: Diagnosis not present

## 2021-02-06 DIAGNOSIS — J3089 Other allergic rhinitis: Secondary | ICD-10-CM | POA: Diagnosis not present

## 2021-02-07 DIAGNOSIS — G4733 Obstructive sleep apnea (adult) (pediatric): Secondary | ICD-10-CM | POA: Diagnosis not present

## 2021-02-13 DIAGNOSIS — J3089 Other allergic rhinitis: Secondary | ICD-10-CM | POA: Diagnosis not present

## 2021-02-13 DIAGNOSIS — J3081 Allergic rhinitis due to animal (cat) (dog) hair and dander: Secondary | ICD-10-CM | POA: Diagnosis not present

## 2021-02-18 DIAGNOSIS — J3081 Allergic rhinitis due to animal (cat) (dog) hair and dander: Secondary | ICD-10-CM | POA: Diagnosis not present

## 2021-02-18 DIAGNOSIS — J3089 Other allergic rhinitis: Secondary | ICD-10-CM | POA: Diagnosis not present

## 2021-02-26 DIAGNOSIS — L218 Other seborrheic dermatitis: Secondary | ICD-10-CM | POA: Diagnosis not present

## 2021-02-26 DIAGNOSIS — L718 Other rosacea: Secondary | ICD-10-CM | POA: Diagnosis not present

## 2021-02-26 DIAGNOSIS — L2089 Other atopic dermatitis: Secondary | ICD-10-CM | POA: Diagnosis not present

## 2021-02-26 DIAGNOSIS — L820 Inflamed seborrheic keratosis: Secondary | ICD-10-CM | POA: Diagnosis not present

## 2021-02-27 DIAGNOSIS — J3089 Other allergic rhinitis: Secondary | ICD-10-CM | POA: Diagnosis not present

## 2021-02-27 DIAGNOSIS — J3081 Allergic rhinitis due to animal (cat) (dog) hair and dander: Secondary | ICD-10-CM | POA: Diagnosis not present

## 2021-03-06 DIAGNOSIS — J3081 Allergic rhinitis due to animal (cat) (dog) hair and dander: Secondary | ICD-10-CM | POA: Diagnosis not present

## 2021-03-06 DIAGNOSIS — J3089 Other allergic rhinitis: Secondary | ICD-10-CM | POA: Diagnosis not present

## 2021-03-09 DIAGNOSIS — G4733 Obstructive sleep apnea (adult) (pediatric): Secondary | ICD-10-CM | POA: Diagnosis not present

## 2021-03-12 DIAGNOSIS — J3081 Allergic rhinitis due to animal (cat) (dog) hair and dander: Secondary | ICD-10-CM | POA: Diagnosis not present

## 2021-03-12 DIAGNOSIS — J3089 Other allergic rhinitis: Secondary | ICD-10-CM | POA: Diagnosis not present

## 2021-03-19 DIAGNOSIS — J3081 Allergic rhinitis due to animal (cat) (dog) hair and dander: Secondary | ICD-10-CM | POA: Diagnosis not present

## 2021-03-19 DIAGNOSIS — G4733 Obstructive sleep apnea (adult) (pediatric): Secondary | ICD-10-CM | POA: Diagnosis not present

## 2021-03-19 DIAGNOSIS — J3089 Other allergic rhinitis: Secondary | ICD-10-CM | POA: Diagnosis not present

## 2021-04-02 DIAGNOSIS — J3081 Allergic rhinitis due to animal (cat) (dog) hair and dander: Secondary | ICD-10-CM | POA: Diagnosis not present

## 2021-04-02 DIAGNOSIS — J3089 Other allergic rhinitis: Secondary | ICD-10-CM | POA: Diagnosis not present

## 2021-04-09 DIAGNOSIS — J3081 Allergic rhinitis due to animal (cat) (dog) hair and dander: Secondary | ICD-10-CM | POA: Diagnosis not present

## 2021-04-09 DIAGNOSIS — J3089 Other allergic rhinitis: Secondary | ICD-10-CM | POA: Diagnosis not present

## 2021-04-09 DIAGNOSIS — J301 Allergic rhinitis due to pollen: Secondary | ICD-10-CM | POA: Diagnosis not present

## 2021-04-16 DIAGNOSIS — J3081 Allergic rhinitis due to animal (cat) (dog) hair and dander: Secondary | ICD-10-CM | POA: Diagnosis not present

## 2021-04-16 DIAGNOSIS — J3089 Other allergic rhinitis: Secondary | ICD-10-CM | POA: Diagnosis not present

## 2021-04-23 DIAGNOSIS — G4733 Obstructive sleep apnea (adult) (pediatric): Secondary | ICD-10-CM | POA: Diagnosis not present

## 2021-04-23 DIAGNOSIS — J3089 Other allergic rhinitis: Secondary | ICD-10-CM | POA: Diagnosis not present

## 2021-04-23 DIAGNOSIS — J3081 Allergic rhinitis due to animal (cat) (dog) hair and dander: Secondary | ICD-10-CM | POA: Diagnosis not present

## 2021-04-27 DIAGNOSIS — J3089 Other allergic rhinitis: Secondary | ICD-10-CM | POA: Diagnosis not present

## 2021-04-28 DIAGNOSIS — J3081 Allergic rhinitis due to animal (cat) (dog) hair and dander: Secondary | ICD-10-CM | POA: Diagnosis not present

## 2021-04-30 DIAGNOSIS — J3081 Allergic rhinitis due to animal (cat) (dog) hair and dander: Secondary | ICD-10-CM | POA: Diagnosis not present

## 2021-04-30 DIAGNOSIS — J3089 Other allergic rhinitis: Secondary | ICD-10-CM | POA: Diagnosis not present

## 2021-05-07 DIAGNOSIS — J3081 Allergic rhinitis due to animal (cat) (dog) hair and dander: Secondary | ICD-10-CM | POA: Diagnosis not present

## 2021-05-07 DIAGNOSIS — J3089 Other allergic rhinitis: Secondary | ICD-10-CM | POA: Diagnosis not present

## 2021-05-08 DIAGNOSIS — K2100 Gastro-esophageal reflux disease with esophagitis, without bleeding: Secondary | ICD-10-CM | POA: Diagnosis not present

## 2021-05-08 DIAGNOSIS — R140 Abdominal distension (gaseous): Secondary | ICD-10-CM | POA: Diagnosis not present

## 2021-05-08 DIAGNOSIS — B3781 Candidal esophagitis: Secondary | ICD-10-CM | POA: Diagnosis not present

## 2021-05-08 DIAGNOSIS — K2930 Chronic superficial gastritis without bleeding: Secondary | ICD-10-CM | POA: Diagnosis not present

## 2021-05-08 DIAGNOSIS — K449 Diaphragmatic hernia without obstruction or gangrene: Secondary | ICD-10-CM | POA: Diagnosis not present

## 2021-05-12 DIAGNOSIS — J3081 Allergic rhinitis due to animal (cat) (dog) hair and dander: Secondary | ICD-10-CM | POA: Diagnosis not present

## 2021-05-12 DIAGNOSIS — J3089 Other allergic rhinitis: Secondary | ICD-10-CM | POA: Diagnosis not present

## 2021-05-12 DIAGNOSIS — J4520 Mild intermittent asthma, uncomplicated: Secondary | ICD-10-CM | POA: Diagnosis not present

## 2021-05-12 DIAGNOSIS — T781XXD Other adverse food reactions, not elsewhere classified, subsequent encounter: Secondary | ICD-10-CM | POA: Diagnosis not present

## 2021-05-22 DIAGNOSIS — J3089 Other allergic rhinitis: Secondary | ICD-10-CM | POA: Diagnosis not present

## 2021-05-22 DIAGNOSIS — F418 Other specified anxiety disorders: Secondary | ICD-10-CM | POA: Diagnosis not present

## 2021-05-22 DIAGNOSIS — J3081 Allergic rhinitis due to animal (cat) (dog) hair and dander: Secondary | ICD-10-CM | POA: Diagnosis not present

## 2021-05-22 DIAGNOSIS — B3781 Candidal esophagitis: Secondary | ICD-10-CM | POA: Diagnosis not present

## 2021-05-24 DIAGNOSIS — G4733 Obstructive sleep apnea (adult) (pediatric): Secondary | ICD-10-CM | POA: Diagnosis not present

## 2021-05-28 DIAGNOSIS — J3089 Other allergic rhinitis: Secondary | ICD-10-CM | POA: Diagnosis not present

## 2021-05-28 DIAGNOSIS — J3081 Allergic rhinitis due to animal (cat) (dog) hair and dander: Secondary | ICD-10-CM | POA: Diagnosis not present

## 2021-06-04 DIAGNOSIS — M25511 Pain in right shoulder: Secondary | ICD-10-CM | POA: Diagnosis not present

## 2021-06-04 DIAGNOSIS — J3089 Other allergic rhinitis: Secondary | ICD-10-CM | POA: Diagnosis not present

## 2021-06-04 DIAGNOSIS — J3081 Allergic rhinitis due to animal (cat) (dog) hair and dander: Secondary | ICD-10-CM | POA: Diagnosis not present

## 2021-06-11 DIAGNOSIS — J3081 Allergic rhinitis due to animal (cat) (dog) hair and dander: Secondary | ICD-10-CM | POA: Diagnosis not present

## 2021-06-11 DIAGNOSIS — M25511 Pain in right shoulder: Secondary | ICD-10-CM | POA: Diagnosis not present

## 2021-06-11 DIAGNOSIS — J3089 Other allergic rhinitis: Secondary | ICD-10-CM | POA: Diagnosis not present

## 2021-06-18 DIAGNOSIS — J3081 Allergic rhinitis due to animal (cat) (dog) hair and dander: Secondary | ICD-10-CM | POA: Diagnosis not present

## 2021-06-18 DIAGNOSIS — J3089 Other allergic rhinitis: Secondary | ICD-10-CM | POA: Diagnosis not present

## 2021-06-21 DIAGNOSIS — G4733 Obstructive sleep apnea (adult) (pediatric): Secondary | ICD-10-CM | POA: Diagnosis not present

## 2021-06-24 DIAGNOSIS — R1013 Epigastric pain: Secondary | ICD-10-CM | POA: Diagnosis not present

## 2021-06-24 DIAGNOSIS — F418 Other specified anxiety disorders: Secondary | ICD-10-CM | POA: Diagnosis not present

## 2021-06-24 DIAGNOSIS — J4520 Mild intermittent asthma, uncomplicated: Secondary | ICD-10-CM | POA: Diagnosis not present

## 2021-06-24 DIAGNOSIS — K219 Gastro-esophageal reflux disease without esophagitis: Secondary | ICD-10-CM | POA: Diagnosis not present

## 2021-06-25 DIAGNOSIS — J3089 Other allergic rhinitis: Secondary | ICD-10-CM | POA: Diagnosis not present

## 2021-06-25 DIAGNOSIS — J3081 Allergic rhinitis due to animal (cat) (dog) hair and dander: Secondary | ICD-10-CM | POA: Diagnosis not present

## 2021-06-25 DIAGNOSIS — Z961 Presence of intraocular lens: Secondary | ICD-10-CM | POA: Diagnosis not present

## 2021-06-25 DIAGNOSIS — H52203 Unspecified astigmatism, bilateral: Secondary | ICD-10-CM | POA: Diagnosis not present

## 2021-06-25 DIAGNOSIS — H35371 Puckering of macula, right eye: Secondary | ICD-10-CM | POA: Diagnosis not present

## 2021-06-25 DIAGNOSIS — J301 Allergic rhinitis due to pollen: Secondary | ICD-10-CM | POA: Diagnosis not present

## 2021-06-25 DIAGNOSIS — M25511 Pain in right shoulder: Secondary | ICD-10-CM | POA: Diagnosis not present

## 2021-07-06 DIAGNOSIS — J3081 Allergic rhinitis due to animal (cat) (dog) hair and dander: Secondary | ICD-10-CM | POA: Diagnosis not present

## 2021-07-06 DIAGNOSIS — J3089 Other allergic rhinitis: Secondary | ICD-10-CM | POA: Diagnosis not present

## 2021-07-06 IMAGING — US US RENAL
1 series · 14 of 25 positions shown · non-contrast
Comparison: None

CLINICAL DATA: Chronic kidney disease stage G3a/A1, hypertension

EXAM:
RENAL / URINARY TRACT ULTRASOUND COMPLETE

[Series 1: us renal · 0.23mm/px · 14 of 34 slices shown]
[im 1/34]
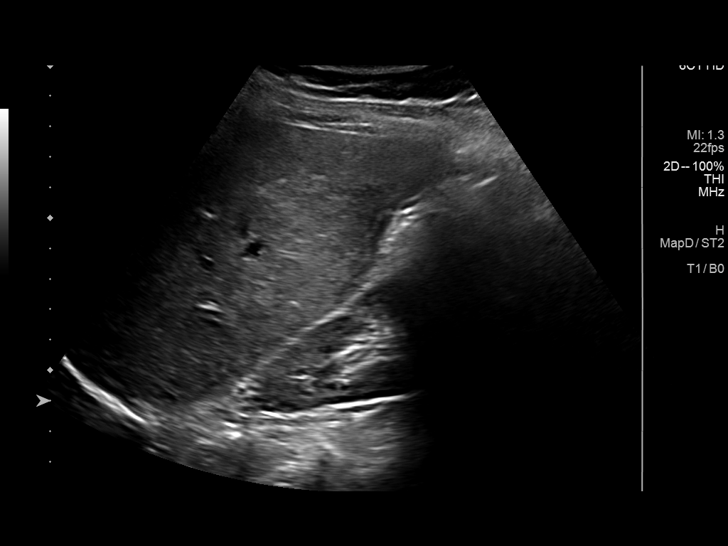
[im 3/34]
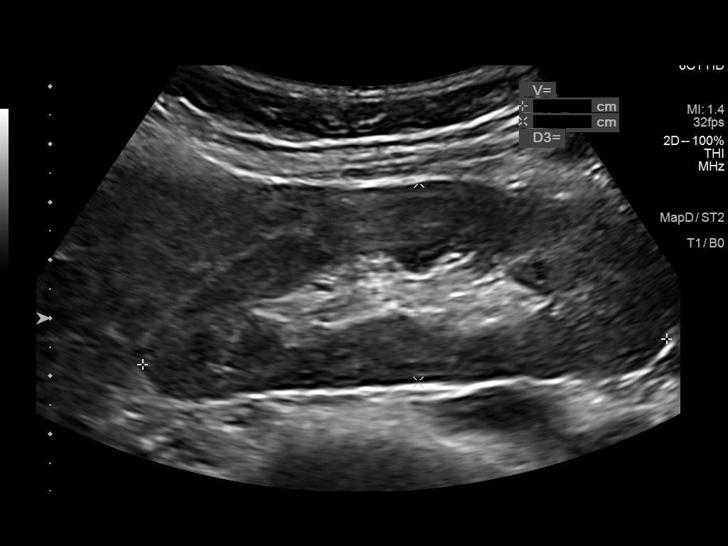
[im 6/34]
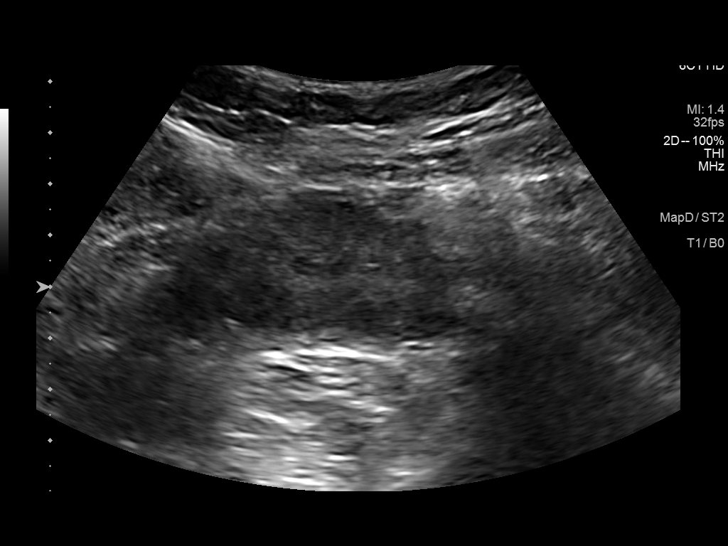
[im 9/34]
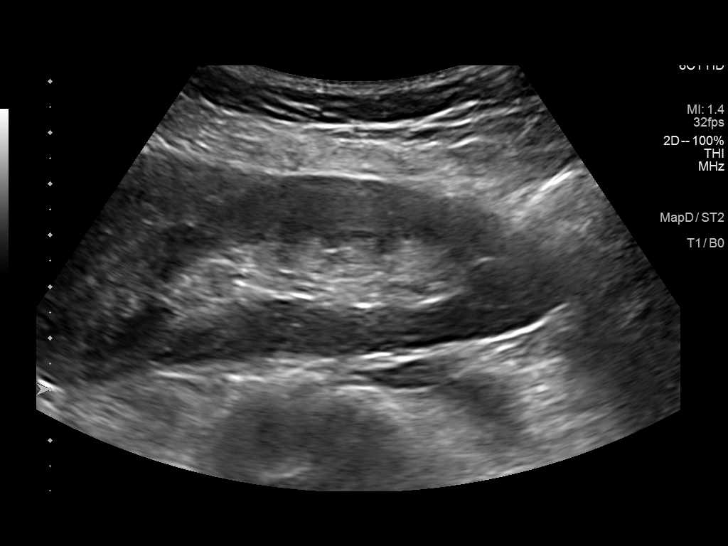
[im 12/34]
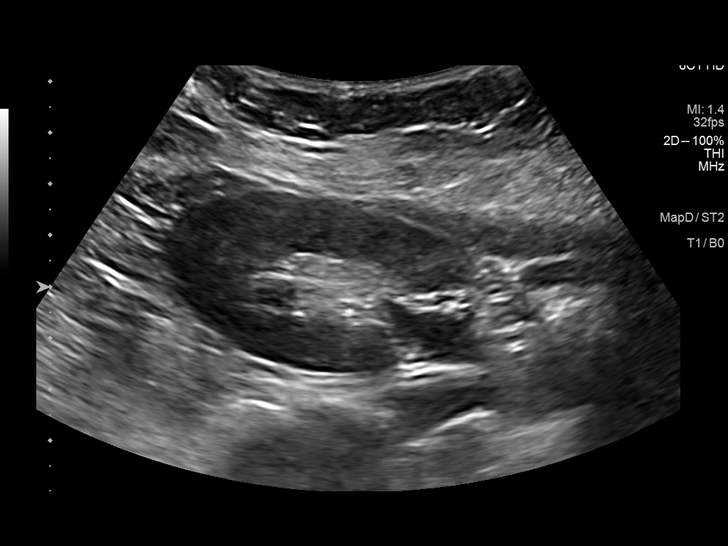
[im 13/34]
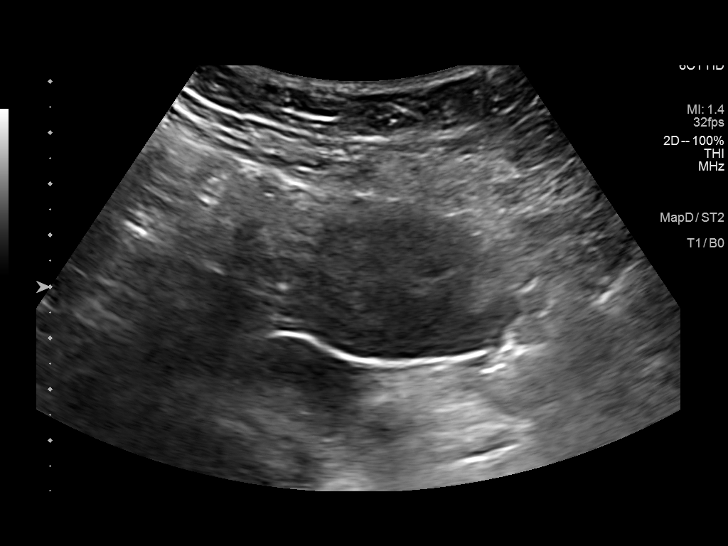
[im 16/34]
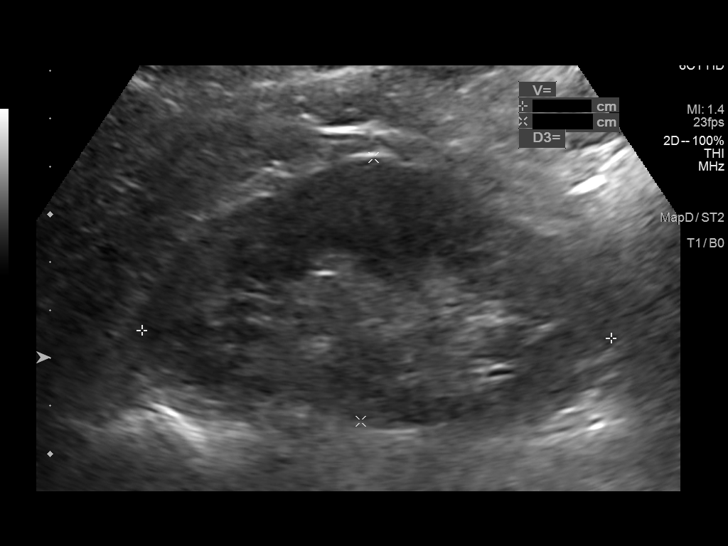
[im 18/34]
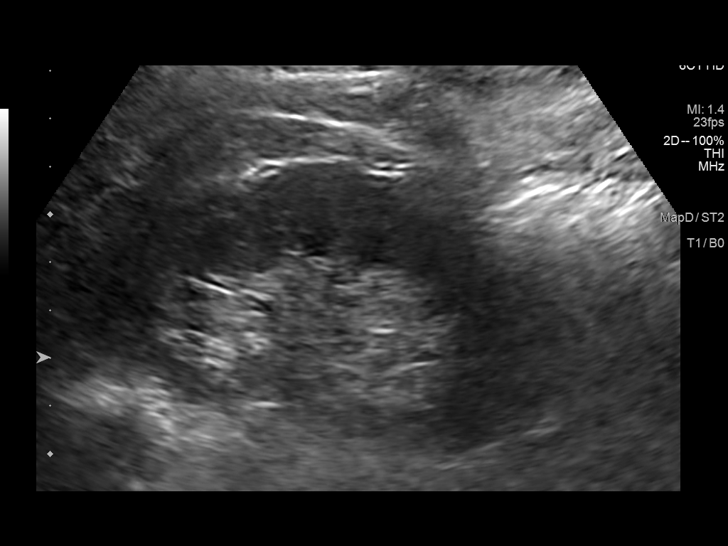
[im 21/34]
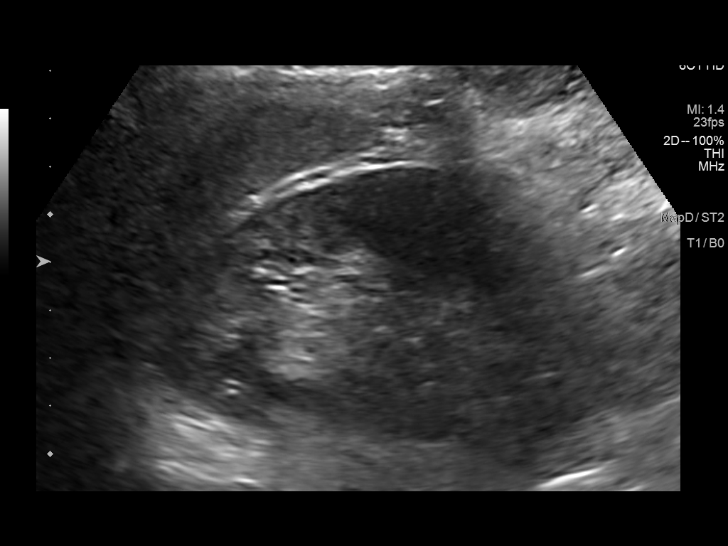
[im 23/34]
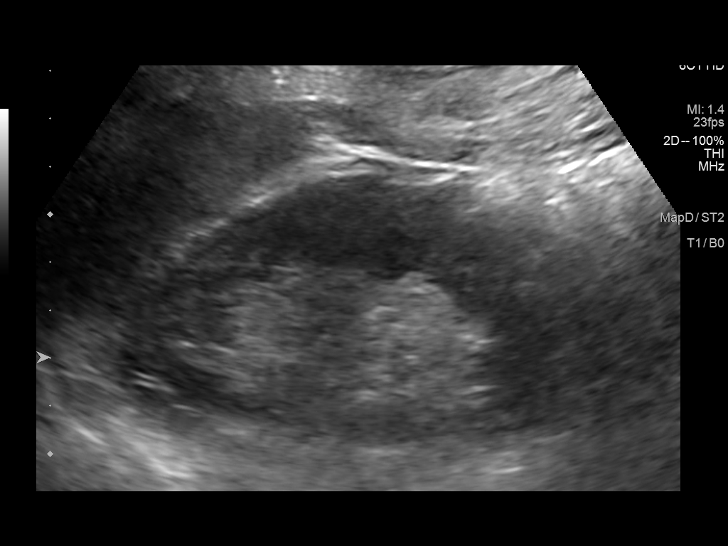
[im 25/34]
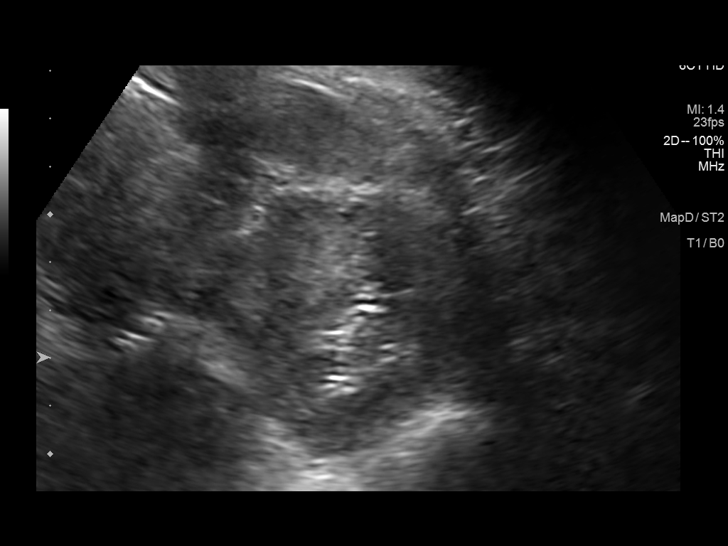
[im 28/34]
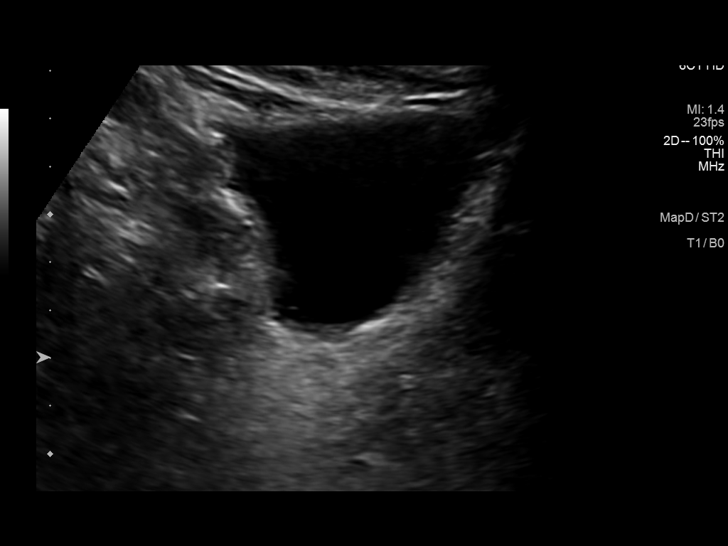
[im 31/34]
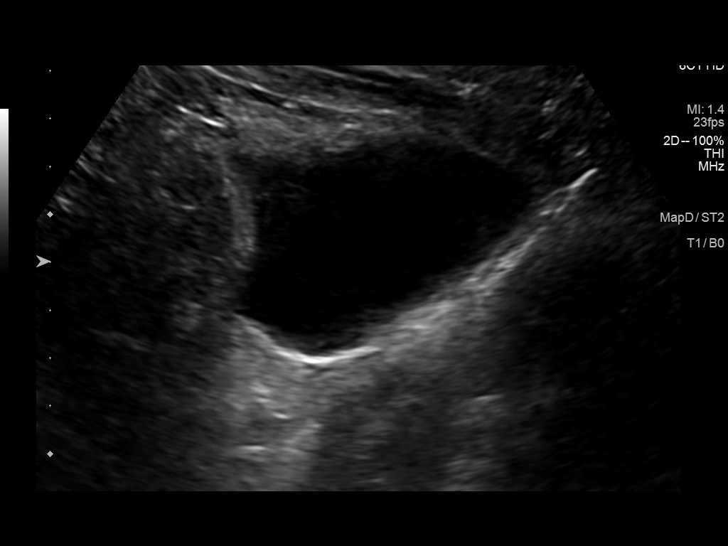
[im 34/34]
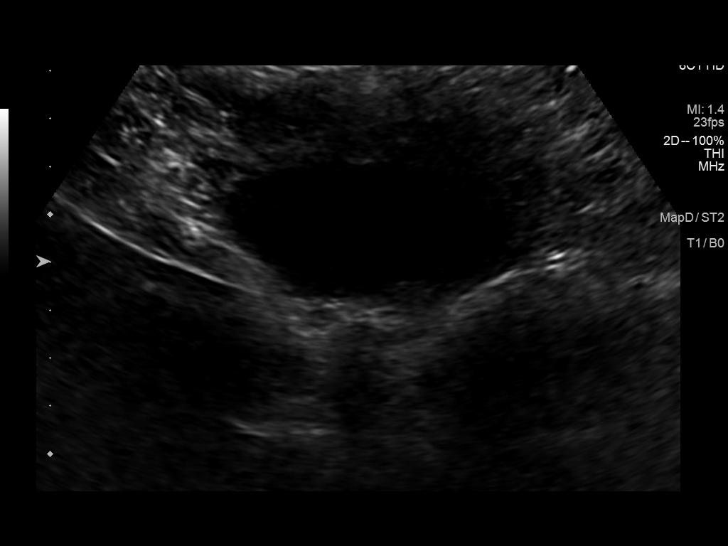

[14 of 25 positions shown; findings below may reference images not displayed]

FINDINGS: Right Kidney:

Renal measurements: 9.0 x 3.4 x 4.4 cm = volume: 71 mL . Normal
cortical thickness and echogenicity. No mass, hydronephrosis or
shadowing calcification.

Left Kidney:

Renal measurements: 9.8 x 5.5 x 4.6 cm = volume: 129 mL. Normal
cortical thickness and echogenicity. No mass, hydronephrosis or
shadowing calcification.

Bladder:

Appears normal for degree of bladder distention.

Other:

N/A
IMPRESSION: Normal renal ultrasound.

## 2021-07-09 DIAGNOSIS — M25511 Pain in right shoulder: Secondary | ICD-10-CM | POA: Diagnosis not present

## 2021-07-17 DIAGNOSIS — J3081 Allergic rhinitis due to animal (cat) (dog) hair and dander: Secondary | ICD-10-CM | POA: Diagnosis not present

## 2021-07-17 DIAGNOSIS — J3089 Other allergic rhinitis: Secondary | ICD-10-CM | POA: Diagnosis not present

## 2021-07-24 DIAGNOSIS — J301 Allergic rhinitis due to pollen: Secondary | ICD-10-CM | POA: Diagnosis not present

## 2021-07-24 DIAGNOSIS — N951 Menopausal and female climacteric states: Secondary | ICD-10-CM | POA: Diagnosis not present

## 2021-07-24 DIAGNOSIS — R1013 Epigastric pain: Secondary | ICD-10-CM | POA: Diagnosis not present

## 2021-07-24 DIAGNOSIS — J3089 Other allergic rhinitis: Secondary | ICD-10-CM | POA: Diagnosis not present

## 2021-07-24 DIAGNOSIS — J3081 Allergic rhinitis due to animal (cat) (dog) hair and dander: Secondary | ICD-10-CM | POA: Diagnosis not present

## 2021-07-24 DIAGNOSIS — G479 Sleep disorder, unspecified: Secondary | ICD-10-CM | POA: Diagnosis not present

## 2021-07-24 DIAGNOSIS — F418 Other specified anxiety disorders: Secondary | ICD-10-CM | POA: Diagnosis not present

## 2021-07-30 DIAGNOSIS — M25511 Pain in right shoulder: Secondary | ICD-10-CM | POA: Diagnosis not present

## 2021-07-30 DIAGNOSIS — J3089 Other allergic rhinitis: Secondary | ICD-10-CM | POA: Diagnosis not present

## 2021-07-30 DIAGNOSIS — J3081 Allergic rhinitis due to animal (cat) (dog) hair and dander: Secondary | ICD-10-CM | POA: Diagnosis not present

## 2021-07-30 DIAGNOSIS — J301 Allergic rhinitis due to pollen: Secondary | ICD-10-CM | POA: Diagnosis not present

## 2021-08-04 DIAGNOSIS — K2970 Gastritis, unspecified, without bleeding: Secondary | ICD-10-CM | POA: Diagnosis not present

## 2021-08-04 DIAGNOSIS — K219 Gastro-esophageal reflux disease without esophagitis: Secondary | ICD-10-CM | POA: Diagnosis not present

## 2021-08-04 DIAGNOSIS — K449 Diaphragmatic hernia without obstruction or gangrene: Secondary | ICD-10-CM | POA: Diagnosis not present

## 2021-08-04 DIAGNOSIS — B3781 Candidal esophagitis: Secondary | ICD-10-CM | POA: Diagnosis not present

## 2021-08-06 DIAGNOSIS — J3081 Allergic rhinitis due to animal (cat) (dog) hair and dander: Secondary | ICD-10-CM | POA: Diagnosis not present

## 2021-08-06 DIAGNOSIS — J301 Allergic rhinitis due to pollen: Secondary | ICD-10-CM | POA: Diagnosis not present

## 2021-08-06 DIAGNOSIS — J3089 Other allergic rhinitis: Secondary | ICD-10-CM | POA: Diagnosis not present

## 2021-08-13 DIAGNOSIS — D224 Melanocytic nevi of scalp and neck: Secondary | ICD-10-CM | POA: Diagnosis not present

## 2021-08-13 DIAGNOSIS — L821 Other seborrheic keratosis: Secondary | ICD-10-CM | POA: Diagnosis not present

## 2021-08-13 DIAGNOSIS — D2271 Melanocytic nevi of right lower limb, including hip: Secondary | ICD-10-CM | POA: Diagnosis not present

## 2021-08-13 DIAGNOSIS — L738 Other specified follicular disorders: Secondary | ICD-10-CM | POA: Diagnosis not present

## 2021-08-14 DIAGNOSIS — J3089 Other allergic rhinitis: Secondary | ICD-10-CM | POA: Diagnosis not present

## 2021-08-14 DIAGNOSIS — J3081 Allergic rhinitis due to animal (cat) (dog) hair and dander: Secondary | ICD-10-CM | POA: Diagnosis not present

## 2021-08-20 DIAGNOSIS — J301 Allergic rhinitis due to pollen: Secondary | ICD-10-CM | POA: Diagnosis not present

## 2021-08-20 DIAGNOSIS — J3081 Allergic rhinitis due to animal (cat) (dog) hair and dander: Secondary | ICD-10-CM | POA: Diagnosis not present

## 2021-08-20 DIAGNOSIS — J3089 Other allergic rhinitis: Secondary | ICD-10-CM | POA: Diagnosis not present

## 2021-08-28 DIAGNOSIS — J3081 Allergic rhinitis due to animal (cat) (dog) hair and dander: Secondary | ICD-10-CM | POA: Diagnosis not present

## 2021-08-28 DIAGNOSIS — J3089 Other allergic rhinitis: Secondary | ICD-10-CM | POA: Diagnosis not present

## 2021-08-28 DIAGNOSIS — J301 Allergic rhinitis due to pollen: Secondary | ICD-10-CM | POA: Diagnosis not present

## 2021-08-31 DIAGNOSIS — H04123 Dry eye syndrome of bilateral lacrimal glands: Secondary | ICD-10-CM | POA: Diagnosis not present

## 2021-08-31 DIAGNOSIS — H10413 Chronic giant papillary conjunctivitis, bilateral: Secondary | ICD-10-CM | POA: Diagnosis not present

## 2021-09-03 DIAGNOSIS — J301 Allergic rhinitis due to pollen: Secondary | ICD-10-CM | POA: Diagnosis not present

## 2021-09-03 DIAGNOSIS — J3089 Other allergic rhinitis: Secondary | ICD-10-CM | POA: Diagnosis not present

## 2021-09-03 DIAGNOSIS — J3081 Allergic rhinitis due to animal (cat) (dog) hair and dander: Secondary | ICD-10-CM | POA: Diagnosis not present

## 2021-09-11 DIAGNOSIS — J3089 Other allergic rhinitis: Secondary | ICD-10-CM | POA: Diagnosis not present

## 2021-09-11 DIAGNOSIS — J301 Allergic rhinitis due to pollen: Secondary | ICD-10-CM | POA: Diagnosis not present

## 2021-09-11 DIAGNOSIS — J3081 Allergic rhinitis due to animal (cat) (dog) hair and dander: Secondary | ICD-10-CM | POA: Diagnosis not present

## 2021-09-14 ENCOUNTER — Other Ambulatory Visit: Payer: Self-pay | Admitting: Gynecology

## 2021-09-14 DIAGNOSIS — E2839 Other primary ovarian failure: Secondary | ICD-10-CM

## 2021-09-17 DIAGNOSIS — J301 Allergic rhinitis due to pollen: Secondary | ICD-10-CM | POA: Diagnosis not present

## 2021-09-17 DIAGNOSIS — J3081 Allergic rhinitis due to animal (cat) (dog) hair and dander: Secondary | ICD-10-CM | POA: Diagnosis not present

## 2021-09-17 DIAGNOSIS — J3089 Other allergic rhinitis: Secondary | ICD-10-CM | POA: Diagnosis not present

## 2021-09-21 DIAGNOSIS — G4733 Obstructive sleep apnea (adult) (pediatric): Secondary | ICD-10-CM | POA: Diagnosis not present

## 2021-09-24 DIAGNOSIS — J3089 Other allergic rhinitis: Secondary | ICD-10-CM | POA: Diagnosis not present

## 2021-09-24 DIAGNOSIS — J3081 Allergic rhinitis due to animal (cat) (dog) hair and dander: Secondary | ICD-10-CM | POA: Diagnosis not present

## 2021-09-24 DIAGNOSIS — J301 Allergic rhinitis due to pollen: Secondary | ICD-10-CM | POA: Diagnosis not present

## 2021-10-02 DIAGNOSIS — J3089 Other allergic rhinitis: Secondary | ICD-10-CM | POA: Diagnosis not present

## 2021-10-02 DIAGNOSIS — J301 Allergic rhinitis due to pollen: Secondary | ICD-10-CM | POA: Diagnosis not present

## 2021-10-02 DIAGNOSIS — J3081 Allergic rhinitis due to animal (cat) (dog) hair and dander: Secondary | ICD-10-CM | POA: Diagnosis not present

## 2021-10-08 DIAGNOSIS — J301 Allergic rhinitis due to pollen: Secondary | ICD-10-CM | POA: Diagnosis not present

## 2021-10-08 DIAGNOSIS — J3081 Allergic rhinitis due to animal (cat) (dog) hair and dander: Secondary | ICD-10-CM | POA: Diagnosis not present

## 2021-10-08 DIAGNOSIS — J3089 Other allergic rhinitis: Secondary | ICD-10-CM | POA: Diagnosis not present

## 2021-10-12 DIAGNOSIS — G4733 Obstructive sleep apnea (adult) (pediatric): Secondary | ICD-10-CM | POA: Diagnosis not present

## 2021-10-16 DIAGNOSIS — J3081 Allergic rhinitis due to animal (cat) (dog) hair and dander: Secondary | ICD-10-CM | POA: Diagnosis not present

## 2021-10-16 DIAGNOSIS — J3089 Other allergic rhinitis: Secondary | ICD-10-CM | POA: Diagnosis not present

## 2021-10-16 DIAGNOSIS — J301 Allergic rhinitis due to pollen: Secondary | ICD-10-CM | POA: Diagnosis not present

## 2021-10-22 DIAGNOSIS — J3081 Allergic rhinitis due to animal (cat) (dog) hair and dander: Secondary | ICD-10-CM | POA: Diagnosis not present

## 2021-10-22 DIAGNOSIS — J301 Allergic rhinitis due to pollen: Secondary | ICD-10-CM | POA: Diagnosis not present

## 2021-10-22 DIAGNOSIS — J3089 Other allergic rhinitis: Secondary | ICD-10-CM | POA: Diagnosis not present

## 2021-10-25 DIAGNOSIS — G4733 Obstructive sleep apnea (adult) (pediatric): Secondary | ICD-10-CM | POA: Diagnosis not present

## 2021-10-29 DIAGNOSIS — J3081 Allergic rhinitis due to animal (cat) (dog) hair and dander: Secondary | ICD-10-CM | POA: Diagnosis not present

## 2021-10-29 DIAGNOSIS — J301 Allergic rhinitis due to pollen: Secondary | ICD-10-CM | POA: Diagnosis not present

## 2021-10-29 DIAGNOSIS — J3089 Other allergic rhinitis: Secondary | ICD-10-CM | POA: Diagnosis not present

## 2021-11-06 DIAGNOSIS — J301 Allergic rhinitis due to pollen: Secondary | ICD-10-CM | POA: Diagnosis not present

## 2021-11-06 DIAGNOSIS — J3089 Other allergic rhinitis: Secondary | ICD-10-CM | POA: Diagnosis not present

## 2021-11-06 DIAGNOSIS — J3081 Allergic rhinitis due to animal (cat) (dog) hair and dander: Secondary | ICD-10-CM | POA: Diagnosis not present

## 2021-11-12 DIAGNOSIS — G4733 Obstructive sleep apnea (adult) (pediatric): Secondary | ICD-10-CM | POA: Diagnosis not present

## 2021-11-12 DIAGNOSIS — J3081 Allergic rhinitis due to animal (cat) (dog) hair and dander: Secondary | ICD-10-CM | POA: Diagnosis not present

## 2021-11-12 DIAGNOSIS — J3089 Other allergic rhinitis: Secondary | ICD-10-CM | POA: Diagnosis not present

## 2021-11-12 DIAGNOSIS — J301 Allergic rhinitis due to pollen: Secondary | ICD-10-CM | POA: Diagnosis not present

## 2021-11-19 DIAGNOSIS — J3089 Other allergic rhinitis: Secondary | ICD-10-CM | POA: Diagnosis not present

## 2021-11-19 DIAGNOSIS — J3081 Allergic rhinitis due to animal (cat) (dog) hair and dander: Secondary | ICD-10-CM | POA: Diagnosis not present

## 2021-11-19 DIAGNOSIS — J301 Allergic rhinitis due to pollen: Secondary | ICD-10-CM | POA: Diagnosis not present

## 2021-11-25 DIAGNOSIS — G4733 Obstructive sleep apnea (adult) (pediatric): Secondary | ICD-10-CM | POA: Diagnosis not present

## 2021-11-27 DIAGNOSIS — J3081 Allergic rhinitis due to animal (cat) (dog) hair and dander: Secondary | ICD-10-CM | POA: Diagnosis not present

## 2021-11-27 DIAGNOSIS — J301 Allergic rhinitis due to pollen: Secondary | ICD-10-CM | POA: Diagnosis not present

## 2021-11-27 DIAGNOSIS — J3089 Other allergic rhinitis: Secondary | ICD-10-CM | POA: Diagnosis not present

## 2021-12-08 DIAGNOSIS — J3089 Other allergic rhinitis: Secondary | ICD-10-CM | POA: Diagnosis not present

## 2021-12-09 DIAGNOSIS — J3081 Allergic rhinitis due to animal (cat) (dog) hair and dander: Secondary | ICD-10-CM | POA: Diagnosis not present

## 2021-12-10 DIAGNOSIS — Z23 Encounter for immunization: Secondary | ICD-10-CM | POA: Diagnosis not present

## 2021-12-10 DIAGNOSIS — J3081 Allergic rhinitis due to animal (cat) (dog) hair and dander: Secondary | ICD-10-CM | POA: Diagnosis not present

## 2021-12-10 DIAGNOSIS — J3089 Other allergic rhinitis: Secondary | ICD-10-CM | POA: Diagnosis not present

## 2021-12-13 DIAGNOSIS — G4733 Obstructive sleep apnea (adult) (pediatric): Secondary | ICD-10-CM | POA: Diagnosis not present

## 2021-12-16 DIAGNOSIS — J4520 Mild intermittent asthma, uncomplicated: Secondary | ICD-10-CM | POA: Diagnosis not present

## 2021-12-16 DIAGNOSIS — Z23 Encounter for immunization: Secondary | ICD-10-CM | POA: Diagnosis not present

## 2021-12-16 DIAGNOSIS — G4733 Obstructive sleep apnea (adult) (pediatric): Secondary | ICD-10-CM | POA: Diagnosis not present

## 2021-12-16 DIAGNOSIS — F418 Other specified anxiety disorders: Secondary | ICD-10-CM | POA: Diagnosis not present

## 2021-12-16 DIAGNOSIS — Z0000 Encounter for general adult medical examination without abnormal findings: Secondary | ICD-10-CM | POA: Diagnosis not present

## 2021-12-16 DIAGNOSIS — N951 Menopausal and female climacteric states: Secondary | ICD-10-CM | POA: Diagnosis not present

## 2021-12-16 DIAGNOSIS — E782 Mixed hyperlipidemia: Secondary | ICD-10-CM | POA: Diagnosis not present

## 2021-12-18 DIAGNOSIS — J3089 Other allergic rhinitis: Secondary | ICD-10-CM | POA: Diagnosis not present

## 2021-12-18 DIAGNOSIS — J3081 Allergic rhinitis due to animal (cat) (dog) hair and dander: Secondary | ICD-10-CM | POA: Diagnosis not present

## 2021-12-22 DIAGNOSIS — G4733 Obstructive sleep apnea (adult) (pediatric): Secondary | ICD-10-CM | POA: Diagnosis not present

## 2021-12-22 DIAGNOSIS — G4719 Other hypersomnia: Secondary | ICD-10-CM | POA: Diagnosis not present

## 2021-12-26 DIAGNOSIS — G4733 Obstructive sleep apnea (adult) (pediatric): Secondary | ICD-10-CM | POA: Diagnosis not present

## 2022-01-01 DIAGNOSIS — J3081 Allergic rhinitis due to animal (cat) (dog) hair and dander: Secondary | ICD-10-CM | POA: Diagnosis not present

## 2022-01-01 DIAGNOSIS — J3089 Other allergic rhinitis: Secondary | ICD-10-CM | POA: Diagnosis not present

## 2022-01-01 DIAGNOSIS — J301 Allergic rhinitis due to pollen: Secondary | ICD-10-CM | POA: Diagnosis not present

## 2022-01-07 DIAGNOSIS — J301 Allergic rhinitis due to pollen: Secondary | ICD-10-CM | POA: Diagnosis not present

## 2022-01-07 DIAGNOSIS — J3089 Other allergic rhinitis: Secondary | ICD-10-CM | POA: Diagnosis not present

## 2022-01-07 DIAGNOSIS — J3081 Allergic rhinitis due to animal (cat) (dog) hair and dander: Secondary | ICD-10-CM | POA: Diagnosis not present

## 2022-01-12 DIAGNOSIS — G4733 Obstructive sleep apnea (adult) (pediatric): Secondary | ICD-10-CM | POA: Diagnosis not present

## 2022-01-15 DIAGNOSIS — J3081 Allergic rhinitis due to animal (cat) (dog) hair and dander: Secondary | ICD-10-CM | POA: Diagnosis not present

## 2022-01-15 DIAGNOSIS — J3089 Other allergic rhinitis: Secondary | ICD-10-CM | POA: Diagnosis not present

## 2022-01-15 DIAGNOSIS — J301 Allergic rhinitis due to pollen: Secondary | ICD-10-CM | POA: Diagnosis not present

## 2022-01-21 DIAGNOSIS — J301 Allergic rhinitis due to pollen: Secondary | ICD-10-CM | POA: Diagnosis not present

## 2022-01-21 DIAGNOSIS — J3089 Other allergic rhinitis: Secondary | ICD-10-CM | POA: Diagnosis not present

## 2022-01-21 DIAGNOSIS — J3081 Allergic rhinitis due to animal (cat) (dog) hair and dander: Secondary | ICD-10-CM | POA: Diagnosis not present

## 2022-01-28 DIAGNOSIS — J301 Allergic rhinitis due to pollen: Secondary | ICD-10-CM | POA: Diagnosis not present

## 2022-01-28 DIAGNOSIS — J3089 Other allergic rhinitis: Secondary | ICD-10-CM | POA: Diagnosis not present

## 2022-01-28 DIAGNOSIS — J3081 Allergic rhinitis due to animal (cat) (dog) hair and dander: Secondary | ICD-10-CM | POA: Diagnosis not present

## 2022-02-05 DIAGNOSIS — J3089 Other allergic rhinitis: Secondary | ICD-10-CM | POA: Diagnosis not present

## 2022-02-05 DIAGNOSIS — J301 Allergic rhinitis due to pollen: Secondary | ICD-10-CM | POA: Diagnosis not present

## 2022-02-05 DIAGNOSIS — J3081 Allergic rhinitis due to animal (cat) (dog) hair and dander: Secondary | ICD-10-CM | POA: Diagnosis not present

## 2022-02-12 DIAGNOSIS — J3089 Other allergic rhinitis: Secondary | ICD-10-CM | POA: Diagnosis not present

## 2022-02-12 DIAGNOSIS — J3081 Allergic rhinitis due to animal (cat) (dog) hair and dander: Secondary | ICD-10-CM | POA: Diagnosis not present

## 2022-02-12 DIAGNOSIS — J301 Allergic rhinitis due to pollen: Secondary | ICD-10-CM | POA: Diagnosis not present

## 2022-02-12 DIAGNOSIS — G4733 Obstructive sleep apnea (adult) (pediatric): Secondary | ICD-10-CM | POA: Diagnosis not present

## 2022-02-16 DIAGNOSIS — J301 Allergic rhinitis due to pollen: Secondary | ICD-10-CM | POA: Diagnosis not present

## 2022-02-16 DIAGNOSIS — J3081 Allergic rhinitis due to animal (cat) (dog) hair and dander: Secondary | ICD-10-CM | POA: Diagnosis not present

## 2022-02-16 DIAGNOSIS — J3089 Other allergic rhinitis: Secondary | ICD-10-CM | POA: Diagnosis not present

## 2022-02-25 DIAGNOSIS — J3089 Other allergic rhinitis: Secondary | ICD-10-CM | POA: Diagnosis not present

## 2022-02-25 DIAGNOSIS — J301 Allergic rhinitis due to pollen: Secondary | ICD-10-CM | POA: Diagnosis not present

## 2022-02-25 DIAGNOSIS — J3081 Allergic rhinitis due to animal (cat) (dog) hair and dander: Secondary | ICD-10-CM | POA: Diagnosis not present

## 2022-03-02 DIAGNOSIS — G4733 Obstructive sleep apnea (adult) (pediatric): Secondary | ICD-10-CM | POA: Diagnosis not present

## 2022-03-04 DIAGNOSIS — J3081 Allergic rhinitis due to animal (cat) (dog) hair and dander: Secondary | ICD-10-CM | POA: Diagnosis not present

## 2022-03-04 DIAGNOSIS — J3089 Other allergic rhinitis: Secondary | ICD-10-CM | POA: Diagnosis not present

## 2022-03-09 DIAGNOSIS — J301 Allergic rhinitis due to pollen: Secondary | ICD-10-CM | POA: Diagnosis not present

## 2022-03-09 DIAGNOSIS — J3081 Allergic rhinitis due to animal (cat) (dog) hair and dander: Secondary | ICD-10-CM | POA: Diagnosis not present

## 2022-03-09 DIAGNOSIS — J3089 Other allergic rhinitis: Secondary | ICD-10-CM | POA: Diagnosis not present

## 2022-03-12 DIAGNOSIS — D125 Benign neoplasm of sigmoid colon: Secondary | ICD-10-CM | POA: Diagnosis not present

## 2022-03-12 DIAGNOSIS — D122 Benign neoplasm of ascending colon: Secondary | ICD-10-CM | POA: Diagnosis not present

## 2022-03-12 DIAGNOSIS — K5730 Diverticulosis of large intestine without perforation or abscess without bleeding: Secondary | ICD-10-CM | POA: Diagnosis not present

## 2022-03-12 DIAGNOSIS — Z86010 Personal history of colonic polyps: Secondary | ICD-10-CM | POA: Diagnosis not present

## 2022-03-12 DIAGNOSIS — D120 Benign neoplasm of cecum: Secondary | ICD-10-CM | POA: Diagnosis not present

## 2022-03-14 DIAGNOSIS — G4733 Obstructive sleep apnea (adult) (pediatric): Secondary | ICD-10-CM | POA: Diagnosis not present

## 2022-03-15 DIAGNOSIS — M25571 Pain in right ankle and joints of right foot: Secondary | ICD-10-CM | POA: Diagnosis not present

## 2022-03-15 DIAGNOSIS — M25572 Pain in left ankle and joints of left foot: Secondary | ICD-10-CM | POA: Diagnosis not present

## 2022-03-16 DIAGNOSIS — D485 Neoplasm of uncertain behavior of skin: Secondary | ICD-10-CM | POA: Diagnosis not present

## 2022-03-16 DIAGNOSIS — L570 Actinic keratosis: Secondary | ICD-10-CM | POA: Diagnosis not present

## 2022-03-16 DIAGNOSIS — L565 Disseminated superficial actinic porokeratosis (DSAP): Secondary | ICD-10-CM | POA: Diagnosis not present

## 2022-03-16 DIAGNOSIS — L700 Acne vulgaris: Secondary | ICD-10-CM | POA: Diagnosis not present

## 2022-03-19 DIAGNOSIS — J301 Allergic rhinitis due to pollen: Secondary | ICD-10-CM | POA: Diagnosis not present

## 2022-03-19 DIAGNOSIS — J3089 Other allergic rhinitis: Secondary | ICD-10-CM | POA: Diagnosis not present

## 2022-03-19 DIAGNOSIS — J3081 Allergic rhinitis due to animal (cat) (dog) hair and dander: Secondary | ICD-10-CM | POA: Diagnosis not present

## 2022-03-30 DIAGNOSIS — J301 Allergic rhinitis due to pollen: Secondary | ICD-10-CM | POA: Diagnosis not present

## 2022-03-30 DIAGNOSIS — J3089 Other allergic rhinitis: Secondary | ICD-10-CM | POA: Diagnosis not present

## 2022-03-30 DIAGNOSIS — J3081 Allergic rhinitis due to animal (cat) (dog) hair and dander: Secondary | ICD-10-CM | POA: Diagnosis not present

## 2022-04-02 DIAGNOSIS — G4733 Obstructive sleep apnea (adult) (pediatric): Secondary | ICD-10-CM | POA: Diagnosis not present

## 2022-04-07 DIAGNOSIS — J3081 Allergic rhinitis due to animal (cat) (dog) hair and dander: Secondary | ICD-10-CM | POA: Diagnosis not present

## 2022-04-07 DIAGNOSIS — J3089 Other allergic rhinitis: Secondary | ICD-10-CM | POA: Diagnosis not present

## 2022-04-07 DIAGNOSIS — J301 Allergic rhinitis due to pollen: Secondary | ICD-10-CM | POA: Diagnosis not present

## 2022-04-14 DIAGNOSIS — G4733 Obstructive sleep apnea (adult) (pediatric): Secondary | ICD-10-CM | POA: Diagnosis not present

## 2022-04-15 DIAGNOSIS — J3089 Other allergic rhinitis: Secondary | ICD-10-CM | POA: Diagnosis not present

## 2022-04-15 DIAGNOSIS — J301 Allergic rhinitis due to pollen: Secondary | ICD-10-CM | POA: Diagnosis not present

## 2022-04-15 DIAGNOSIS — J3081 Allergic rhinitis due to animal (cat) (dog) hair and dander: Secondary | ICD-10-CM | POA: Diagnosis not present

## 2022-04-23 DIAGNOSIS — J301 Allergic rhinitis due to pollen: Secondary | ICD-10-CM | POA: Diagnosis not present

## 2022-04-23 DIAGNOSIS — J3089 Other allergic rhinitis: Secondary | ICD-10-CM | POA: Diagnosis not present

## 2022-04-23 DIAGNOSIS — J3081 Allergic rhinitis due to animal (cat) (dog) hair and dander: Secondary | ICD-10-CM | POA: Diagnosis not present

## 2022-04-29 DIAGNOSIS — J3081 Allergic rhinitis due to animal (cat) (dog) hair and dander: Secondary | ICD-10-CM | POA: Diagnosis not present

## 2022-04-29 DIAGNOSIS — J3089 Other allergic rhinitis: Secondary | ICD-10-CM | POA: Diagnosis not present

## 2022-05-03 DIAGNOSIS — G4733 Obstructive sleep apnea (adult) (pediatric): Secondary | ICD-10-CM | POA: Diagnosis not present

## 2022-05-07 DIAGNOSIS — J3089 Other allergic rhinitis: Secondary | ICD-10-CM | POA: Diagnosis not present

## 2022-05-07 DIAGNOSIS — J3081 Allergic rhinitis due to animal (cat) (dog) hair and dander: Secondary | ICD-10-CM | POA: Diagnosis not present

## 2022-05-07 DIAGNOSIS — J301 Allergic rhinitis due to pollen: Secondary | ICD-10-CM | POA: Diagnosis not present

## 2022-05-12 DIAGNOSIS — J3081 Allergic rhinitis due to animal (cat) (dog) hair and dander: Secondary | ICD-10-CM | POA: Diagnosis not present

## 2022-05-12 DIAGNOSIS — J301 Allergic rhinitis due to pollen: Secondary | ICD-10-CM | POA: Diagnosis not present

## 2022-05-12 DIAGNOSIS — J4520 Mild intermittent asthma, uncomplicated: Secondary | ICD-10-CM | POA: Diagnosis not present

## 2022-05-12 DIAGNOSIS — T781XXD Other adverse food reactions, not elsewhere classified, subsequent encounter: Secondary | ICD-10-CM | POA: Diagnosis not present

## 2022-05-12 DIAGNOSIS — J3089 Other allergic rhinitis: Secondary | ICD-10-CM | POA: Diagnosis not present

## 2022-05-15 DIAGNOSIS — G4733 Obstructive sleep apnea (adult) (pediatric): Secondary | ICD-10-CM | POA: Diagnosis not present

## 2022-05-20 DIAGNOSIS — J3089 Other allergic rhinitis: Secondary | ICD-10-CM | POA: Diagnosis not present

## 2022-05-20 DIAGNOSIS — J3081 Allergic rhinitis due to animal (cat) (dog) hair and dander: Secondary | ICD-10-CM | POA: Diagnosis not present

## 2022-05-28 DIAGNOSIS — J3081 Allergic rhinitis due to animal (cat) (dog) hair and dander: Secondary | ICD-10-CM | POA: Diagnosis not present

## 2022-05-28 DIAGNOSIS — J3089 Other allergic rhinitis: Secondary | ICD-10-CM | POA: Diagnosis not present

## 2022-05-28 DIAGNOSIS — J301 Allergic rhinitis due to pollen: Secondary | ICD-10-CM | POA: Diagnosis not present

## 2022-05-31 DIAGNOSIS — G4733 Obstructive sleep apnea (adult) (pediatric): Secondary | ICD-10-CM | POA: Diagnosis not present

## 2022-06-01 DIAGNOSIS — G4733 Obstructive sleep apnea (adult) (pediatric): Secondary | ICD-10-CM | POA: Diagnosis not present

## 2022-06-03 DIAGNOSIS — J3089 Other allergic rhinitis: Secondary | ICD-10-CM | POA: Diagnosis not present

## 2022-06-03 DIAGNOSIS — J3081 Allergic rhinitis due to animal (cat) (dog) hair and dander: Secondary | ICD-10-CM | POA: Diagnosis not present

## 2022-06-10 ENCOUNTER — Other Ambulatory Visit: Payer: Self-pay | Admitting: Family Medicine

## 2022-06-10 DIAGNOSIS — E2839 Other primary ovarian failure: Secondary | ICD-10-CM

## 2022-06-11 DIAGNOSIS — L819 Disorder of pigmentation, unspecified: Secondary | ICD-10-CM | POA: Diagnosis not present

## 2022-06-11 DIAGNOSIS — J301 Allergic rhinitis due to pollen: Secondary | ICD-10-CM | POA: Diagnosis not present

## 2022-06-11 DIAGNOSIS — J3089 Other allergic rhinitis: Secondary | ICD-10-CM | POA: Diagnosis not present

## 2022-06-11 DIAGNOSIS — L300 Nummular dermatitis: Secondary | ICD-10-CM | POA: Diagnosis not present

## 2022-06-11 DIAGNOSIS — J3081 Allergic rhinitis due to animal (cat) (dog) hair and dander: Secondary | ICD-10-CM | POA: Diagnosis not present

## 2022-06-11 DIAGNOSIS — L700 Acne vulgaris: Secondary | ICD-10-CM | POA: Diagnosis not present

## 2022-06-13 DIAGNOSIS — G4733 Obstructive sleep apnea (adult) (pediatric): Secondary | ICD-10-CM | POA: Diagnosis not present

## 2022-06-16 ENCOUNTER — Ambulatory Visit
Admission: RE | Admit: 2022-06-16 | Discharge: 2022-06-16 | Disposition: A | Discharge status: Home / Self Care | Payer: BC Managed Care – PPO | Source: Ambulatory Visit | Attending: *Deleted | Admitting: *Deleted

## 2022-06-16 DIAGNOSIS — Z780 Asymptomatic menopausal state: Secondary | ICD-10-CM | POA: Diagnosis not present

## 2022-06-16 DIAGNOSIS — M85852 Other specified disorders of bone density and structure, left thigh: Secondary | ICD-10-CM | POA: Diagnosis not present

## 2022-06-16 DIAGNOSIS — E2839 Other primary ovarian failure: Secondary | ICD-10-CM

## 2022-06-17 DIAGNOSIS — J301 Allergic rhinitis due to pollen: Secondary | ICD-10-CM | POA: Diagnosis not present

## 2022-06-17 DIAGNOSIS — J3089 Other allergic rhinitis: Secondary | ICD-10-CM | POA: Diagnosis not present

## 2022-06-17 DIAGNOSIS — F418 Other specified anxiety disorders: Secondary | ICD-10-CM | POA: Diagnosis not present

## 2022-06-17 DIAGNOSIS — J3081 Allergic rhinitis due to animal (cat) (dog) hair and dander: Secondary | ICD-10-CM | POA: Diagnosis not present

## 2022-06-23 DIAGNOSIS — J3089 Other allergic rhinitis: Secondary | ICD-10-CM | POA: Diagnosis not present

## 2022-06-23 DIAGNOSIS — J3081 Allergic rhinitis due to animal (cat) (dog) hair and dander: Secondary | ICD-10-CM | POA: Diagnosis not present

## 2022-06-23 DIAGNOSIS — J301 Allergic rhinitis due to pollen: Secondary | ICD-10-CM | POA: Diagnosis not present

## 2022-06-30 DIAGNOSIS — J3081 Allergic rhinitis due to animal (cat) (dog) hair and dander: Secondary | ICD-10-CM | POA: Diagnosis not present

## 2022-06-30 DIAGNOSIS — H52203 Unspecified astigmatism, bilateral: Secondary | ICD-10-CM | POA: Diagnosis not present

## 2022-06-30 DIAGNOSIS — Z961 Presence of intraocular lens: Secondary | ICD-10-CM | POA: Diagnosis not present

## 2022-06-30 DIAGNOSIS — J3089 Other allergic rhinitis: Secondary | ICD-10-CM | POA: Diagnosis not present

## 2022-06-30 DIAGNOSIS — H524 Presbyopia: Secondary | ICD-10-CM | POA: Diagnosis not present

## 2022-06-30 DIAGNOSIS — H31001 Unspecified chorioretinal scars, right eye: Secondary | ICD-10-CM | POA: Diagnosis not present

## 2022-06-30 DIAGNOSIS — J301 Allergic rhinitis due to pollen: Secondary | ICD-10-CM | POA: Diagnosis not present

## 2022-06-30 DIAGNOSIS — H35371 Puckering of macula, right eye: Secondary | ICD-10-CM | POA: Diagnosis not present

## 2022-07-01 DIAGNOSIS — G4733 Obstructive sleep apnea (adult) (pediatric): Secondary | ICD-10-CM | POA: Diagnosis not present

## 2022-07-08 DIAGNOSIS — J3081 Allergic rhinitis due to animal (cat) (dog) hair and dander: Secondary | ICD-10-CM | POA: Diagnosis not present

## 2022-07-08 DIAGNOSIS — J301 Allergic rhinitis due to pollen: Secondary | ICD-10-CM | POA: Diagnosis not present

## 2022-07-08 DIAGNOSIS — J3089 Other allergic rhinitis: Secondary | ICD-10-CM | POA: Diagnosis not present

## 2022-07-13 DIAGNOSIS — J3089 Other allergic rhinitis: Secondary | ICD-10-CM | POA: Diagnosis not present

## 2022-07-14 DIAGNOSIS — J3081 Allergic rhinitis due to animal (cat) (dog) hair and dander: Secondary | ICD-10-CM | POA: Diagnosis not present

## 2022-07-14 DIAGNOSIS — G4733 Obstructive sleep apnea (adult) (pediatric): Secondary | ICD-10-CM | POA: Diagnosis not present

## 2022-07-15 DIAGNOSIS — J301 Allergic rhinitis due to pollen: Secondary | ICD-10-CM | POA: Diagnosis not present

## 2022-07-15 DIAGNOSIS — J3089 Other allergic rhinitis: Secondary | ICD-10-CM | POA: Diagnosis not present

## 2022-07-15 DIAGNOSIS — J3081 Allergic rhinitis due to animal (cat) (dog) hair and dander: Secondary | ICD-10-CM | POA: Diagnosis not present

## 2022-07-22 DIAGNOSIS — J3089 Other allergic rhinitis: Secondary | ICD-10-CM | POA: Diagnosis not present

## 2022-07-22 DIAGNOSIS — J3081 Allergic rhinitis due to animal (cat) (dog) hair and dander: Secondary | ICD-10-CM | POA: Diagnosis not present

## 2022-07-28 DIAGNOSIS — Z1151 Encounter for screening for human papillomavirus (HPV): Secondary | ICD-10-CM | POA: Diagnosis not present

## 2022-07-28 DIAGNOSIS — Z01419 Encounter for gynecological examination (general) (routine) without abnormal findings: Secondary | ICD-10-CM | POA: Diagnosis not present

## 2022-07-28 DIAGNOSIS — Z6825 Body mass index (BMI) 25.0-25.9, adult: Secondary | ICD-10-CM | POA: Diagnosis not present

## 2022-07-28 DIAGNOSIS — Z124 Encounter for screening for malignant neoplasm of cervix: Secondary | ICD-10-CM | POA: Diagnosis not present

## 2022-07-29 DIAGNOSIS — J301 Allergic rhinitis due to pollen: Secondary | ICD-10-CM | POA: Diagnosis not present

## 2022-07-29 DIAGNOSIS — J3081 Allergic rhinitis due to animal (cat) (dog) hair and dander: Secondary | ICD-10-CM | POA: Diagnosis not present

## 2022-07-29 DIAGNOSIS — J3089 Other allergic rhinitis: Secondary | ICD-10-CM | POA: Diagnosis not present

## 2022-07-31 DIAGNOSIS — G4733 Obstructive sleep apnea (adult) (pediatric): Secondary | ICD-10-CM | POA: Diagnosis not present

## 2022-08-05 DIAGNOSIS — J3089 Other allergic rhinitis: Secondary | ICD-10-CM | POA: Diagnosis not present

## 2022-08-05 DIAGNOSIS — J3081 Allergic rhinitis due to animal (cat) (dog) hair and dander: Secondary | ICD-10-CM | POA: Diagnosis not present

## 2022-08-05 DIAGNOSIS — J301 Allergic rhinitis due to pollen: Secondary | ICD-10-CM | POA: Diagnosis not present

## 2022-08-12 DIAGNOSIS — J3081 Allergic rhinitis due to animal (cat) (dog) hair and dander: Secondary | ICD-10-CM | POA: Diagnosis not present

## 2022-08-12 DIAGNOSIS — J301 Allergic rhinitis due to pollen: Secondary | ICD-10-CM | POA: Diagnosis not present

## 2022-08-12 DIAGNOSIS — J3089 Other allergic rhinitis: Secondary | ICD-10-CM | POA: Diagnosis not present

## 2022-08-13 DIAGNOSIS — G4733 Obstructive sleep apnea (adult) (pediatric): Secondary | ICD-10-CM | POA: Diagnosis not present

## 2022-08-19 DIAGNOSIS — J3089 Other allergic rhinitis: Secondary | ICD-10-CM | POA: Diagnosis not present

## 2022-08-19 DIAGNOSIS — J3081 Allergic rhinitis due to animal (cat) (dog) hair and dander: Secondary | ICD-10-CM | POA: Diagnosis not present

## 2022-08-19 DIAGNOSIS — J301 Allergic rhinitis due to pollen: Secondary | ICD-10-CM | POA: Diagnosis not present

## 2022-08-26 DIAGNOSIS — J3089 Other allergic rhinitis: Secondary | ICD-10-CM | POA: Diagnosis not present

## 2022-08-26 DIAGNOSIS — J301 Allergic rhinitis due to pollen: Secondary | ICD-10-CM | POA: Diagnosis not present

## 2022-08-26 DIAGNOSIS — J3081 Allergic rhinitis due to animal (cat) (dog) hair and dander: Secondary | ICD-10-CM | POA: Diagnosis not present

## 2022-08-30 DIAGNOSIS — G4733 Obstructive sleep apnea (adult) (pediatric): Secondary | ICD-10-CM | POA: Diagnosis not present

## 2022-09-01 DIAGNOSIS — G4733 Obstructive sleep apnea (adult) (pediatric): Secondary | ICD-10-CM | POA: Diagnosis not present

## 2022-09-02 DIAGNOSIS — Z91030 Bee allergy status: Secondary | ICD-10-CM | POA: Diagnosis not present

## 2022-09-09 DIAGNOSIS — J3089 Other allergic rhinitis: Secondary | ICD-10-CM | POA: Diagnosis not present

## 2022-09-09 DIAGNOSIS — J3081 Allergic rhinitis due to animal (cat) (dog) hair and dander: Secondary | ICD-10-CM | POA: Diagnosis not present

## 2022-09-09 DIAGNOSIS — J301 Allergic rhinitis due to pollen: Secondary | ICD-10-CM | POA: Diagnosis not present

## 2022-09-17 DIAGNOSIS — J3089 Other allergic rhinitis: Secondary | ICD-10-CM | POA: Diagnosis not present

## 2022-09-17 DIAGNOSIS — J301 Allergic rhinitis due to pollen: Secondary | ICD-10-CM | POA: Diagnosis not present

## 2022-09-17 DIAGNOSIS — J3081 Allergic rhinitis due to animal (cat) (dog) hair and dander: Secondary | ICD-10-CM | POA: Diagnosis not present

## 2022-09-23 DIAGNOSIS — J3081 Allergic rhinitis due to animal (cat) (dog) hair and dander: Secondary | ICD-10-CM | POA: Diagnosis not present

## 2022-09-23 DIAGNOSIS — J301 Allergic rhinitis due to pollen: Secondary | ICD-10-CM | POA: Diagnosis not present

## 2022-09-23 DIAGNOSIS — J3089 Other allergic rhinitis: Secondary | ICD-10-CM | POA: Diagnosis not present

## 2022-09-29 DIAGNOSIS — J301 Allergic rhinitis due to pollen: Secondary | ICD-10-CM | POA: Diagnosis not present

## 2022-09-29 DIAGNOSIS — J3089 Other allergic rhinitis: Secondary | ICD-10-CM | POA: Diagnosis not present

## 2022-09-29 DIAGNOSIS — J3081 Allergic rhinitis due to animal (cat) (dog) hair and dander: Secondary | ICD-10-CM | POA: Diagnosis not present

## 2022-09-29 DIAGNOSIS — G4733 Obstructive sleep apnea (adult) (pediatric): Secondary | ICD-10-CM | POA: Diagnosis not present

## 2022-10-07 DIAGNOSIS — L720 Epidermal cyst: Secondary | ICD-10-CM | POA: Diagnosis not present

## 2022-10-07 DIAGNOSIS — J3089 Other allergic rhinitis: Secondary | ICD-10-CM | POA: Diagnosis not present

## 2022-10-07 DIAGNOSIS — L218 Other seborrheic dermatitis: Secondary | ICD-10-CM | POA: Diagnosis not present

## 2022-10-07 DIAGNOSIS — J3081 Allergic rhinitis due to animal (cat) (dog) hair and dander: Secondary | ICD-10-CM | POA: Diagnosis not present

## 2022-10-07 DIAGNOSIS — L245 Irritant contact dermatitis due to other chemical products: Secondary | ICD-10-CM | POA: Diagnosis not present

## 2022-10-07 DIAGNOSIS — J301 Allergic rhinitis due to pollen: Secondary | ICD-10-CM | POA: Diagnosis not present

## 2022-10-07 DIAGNOSIS — L738 Other specified follicular disorders: Secondary | ICD-10-CM | POA: Diagnosis not present

## 2022-10-12 ENCOUNTER — Ambulatory Visit: Payer: BC Managed Care – PPO | Admitting: Sports Medicine

## 2022-10-14 DIAGNOSIS — J3081 Allergic rhinitis due to animal (cat) (dog) hair and dander: Secondary | ICD-10-CM | POA: Diagnosis not present

## 2022-10-14 DIAGNOSIS — J3089 Other allergic rhinitis: Secondary | ICD-10-CM | POA: Diagnosis not present

## 2022-10-20 DIAGNOSIS — J3089 Other allergic rhinitis: Secondary | ICD-10-CM | POA: Diagnosis not present

## 2022-10-20 DIAGNOSIS — J301 Allergic rhinitis due to pollen: Secondary | ICD-10-CM | POA: Diagnosis not present

## 2022-10-20 DIAGNOSIS — J3081 Allergic rhinitis due to animal (cat) (dog) hair and dander: Secondary | ICD-10-CM | POA: Diagnosis not present

## 2022-10-28 DIAGNOSIS — J3089 Other allergic rhinitis: Secondary | ICD-10-CM | POA: Diagnosis not present

## 2022-10-28 DIAGNOSIS — J3081 Allergic rhinitis due to animal (cat) (dog) hair and dander: Secondary | ICD-10-CM | POA: Diagnosis not present

## 2022-10-30 DIAGNOSIS — G4733 Obstructive sleep apnea (adult) (pediatric): Secondary | ICD-10-CM | POA: Diagnosis not present

## 2022-11-05 DIAGNOSIS — J3081 Allergic rhinitis due to animal (cat) (dog) hair and dander: Secondary | ICD-10-CM | POA: Diagnosis not present

## 2022-11-05 DIAGNOSIS — J3089 Other allergic rhinitis: Secondary | ICD-10-CM | POA: Diagnosis not present

## 2022-11-10 DIAGNOSIS — J301 Allergic rhinitis due to pollen: Secondary | ICD-10-CM | POA: Diagnosis not present

## 2022-11-10 DIAGNOSIS — J3089 Other allergic rhinitis: Secondary | ICD-10-CM | POA: Diagnosis not present

## 2022-11-10 DIAGNOSIS — J3081 Allergic rhinitis due to animal (cat) (dog) hair and dander: Secondary | ICD-10-CM | POA: Diagnosis not present

## 2022-11-17 DIAGNOSIS — M21621 Bunionette of right foot: Secondary | ICD-10-CM | POA: Diagnosis not present

## 2022-11-17 DIAGNOSIS — J301 Allergic rhinitis due to pollen: Secondary | ICD-10-CM | POA: Diagnosis not present

## 2022-11-17 DIAGNOSIS — J3089 Other allergic rhinitis: Secondary | ICD-10-CM | POA: Diagnosis not present

## 2022-11-17 DIAGNOSIS — J3081 Allergic rhinitis due to animal (cat) (dog) hair and dander: Secondary | ICD-10-CM | POA: Diagnosis not present

## 2022-11-26 DIAGNOSIS — J301 Allergic rhinitis due to pollen: Secondary | ICD-10-CM | POA: Diagnosis not present

## 2022-11-26 DIAGNOSIS — J3089 Other allergic rhinitis: Secondary | ICD-10-CM | POA: Diagnosis not present

## 2022-11-26 DIAGNOSIS — J3081 Allergic rhinitis due to animal (cat) (dog) hair and dander: Secondary | ICD-10-CM | POA: Diagnosis not present

## 2022-11-30 DIAGNOSIS — G4733 Obstructive sleep apnea (adult) (pediatric): Secondary | ICD-10-CM | POA: Diagnosis not present

## 2022-12-02 DIAGNOSIS — J3089 Other allergic rhinitis: Secondary | ICD-10-CM | POA: Diagnosis not present

## 2022-12-02 DIAGNOSIS — J3081 Allergic rhinitis due to animal (cat) (dog) hair and dander: Secondary | ICD-10-CM | POA: Diagnosis not present

## 2022-12-02 DIAGNOSIS — G4733 Obstructive sleep apnea (adult) (pediatric): Secondary | ICD-10-CM | POA: Diagnosis not present

## 2022-12-09 DIAGNOSIS — J3089 Other allergic rhinitis: Secondary | ICD-10-CM | POA: Diagnosis not present

## 2022-12-09 DIAGNOSIS — J301 Allergic rhinitis due to pollen: Secondary | ICD-10-CM | POA: Diagnosis not present

## 2022-12-09 DIAGNOSIS — J3081 Allergic rhinitis due to animal (cat) (dog) hair and dander: Secondary | ICD-10-CM | POA: Diagnosis not present

## 2022-12-14 DIAGNOSIS — J3089 Other allergic rhinitis: Secondary | ICD-10-CM | POA: Diagnosis not present

## 2022-12-14 DIAGNOSIS — J3081 Allergic rhinitis due to animal (cat) (dog) hair and dander: Secondary | ICD-10-CM | POA: Diagnosis not present

## 2022-12-14 DIAGNOSIS — J301 Allergic rhinitis due to pollen: Secondary | ICD-10-CM | POA: Diagnosis not present

## 2022-12-22 DIAGNOSIS — J3089 Other allergic rhinitis: Secondary | ICD-10-CM | POA: Diagnosis not present

## 2022-12-22 DIAGNOSIS — J3081 Allergic rhinitis due to animal (cat) (dog) hair and dander: Secondary | ICD-10-CM | POA: Diagnosis not present

## 2022-12-29 DIAGNOSIS — G4733 Obstructive sleep apnea (adult) (pediatric): Secondary | ICD-10-CM | POA: Diagnosis not present

## 2022-12-30 DIAGNOSIS — G4733 Obstructive sleep apnea (adult) (pediatric): Secondary | ICD-10-CM | POA: Diagnosis not present

## 2023-01-04 DIAGNOSIS — J4520 Mild intermittent asthma, uncomplicated: Secondary | ICD-10-CM | POA: Diagnosis not present

## 2023-01-04 DIAGNOSIS — Z13220 Encounter for screening for lipoid disorders: Secondary | ICD-10-CM | POA: Diagnosis not present

## 2023-01-04 DIAGNOSIS — Z0000 Encounter for general adult medical examination without abnormal findings: Secondary | ICD-10-CM | POA: Diagnosis not present

## 2023-01-04 DIAGNOSIS — J3089 Other allergic rhinitis: Secondary | ICD-10-CM | POA: Diagnosis not present

## 2023-01-04 DIAGNOSIS — J3081 Allergic rhinitis due to animal (cat) (dog) hair and dander: Secondary | ICD-10-CM | POA: Diagnosis not present

## 2023-01-04 DIAGNOSIS — J301 Allergic rhinitis due to pollen: Secondary | ICD-10-CM | POA: Diagnosis not present

## 2023-01-04 DIAGNOSIS — F418 Other specified anxiety disorders: Secondary | ICD-10-CM | POA: Diagnosis not present

## 2023-01-11 DIAGNOSIS — M2011 Hallux valgus (acquired), right foot: Secondary | ICD-10-CM | POA: Diagnosis not present

## 2023-01-11 DIAGNOSIS — M21622 Bunionette of left foot: Secondary | ICD-10-CM | POA: Diagnosis not present

## 2023-01-12 DIAGNOSIS — J301 Allergic rhinitis due to pollen: Secondary | ICD-10-CM | POA: Diagnosis not present

## 2023-01-12 DIAGNOSIS — J3081 Allergic rhinitis due to animal (cat) (dog) hair and dander: Secondary | ICD-10-CM | POA: Diagnosis not present

## 2023-01-12 DIAGNOSIS — J3089 Other allergic rhinitis: Secondary | ICD-10-CM | POA: Diagnosis not present

## 2023-01-13 DIAGNOSIS — J3089 Other allergic rhinitis: Secondary | ICD-10-CM | POA: Diagnosis not present

## 2023-01-14 DIAGNOSIS — J3081 Allergic rhinitis due to animal (cat) (dog) hair and dander: Secondary | ICD-10-CM | POA: Diagnosis not present

## 2023-01-18 DIAGNOSIS — L2089 Other atopic dermatitis: Secondary | ICD-10-CM | POA: Diagnosis not present

## 2023-01-18 DIAGNOSIS — L438 Other lichen planus: Secondary | ICD-10-CM | POA: Diagnosis not present

## 2023-01-21 DIAGNOSIS — J3089 Other allergic rhinitis: Secondary | ICD-10-CM | POA: Diagnosis not present

## 2023-01-21 DIAGNOSIS — J3081 Allergic rhinitis due to animal (cat) (dog) hair and dander: Secondary | ICD-10-CM | POA: Diagnosis not present

## 2023-01-26 DIAGNOSIS — J3081 Allergic rhinitis due to animal (cat) (dog) hair and dander: Secondary | ICD-10-CM | POA: Diagnosis not present

## 2023-01-26 DIAGNOSIS — J3089 Other allergic rhinitis: Secondary | ICD-10-CM | POA: Diagnosis not present

## 2023-01-30 DIAGNOSIS — G4733 Obstructive sleep apnea (adult) (pediatric): Secondary | ICD-10-CM | POA: Diagnosis not present

## 2023-02-01 DIAGNOSIS — L280 Lichen simplex chronicus: Secondary | ICD-10-CM | POA: Diagnosis not present

## 2023-02-01 DIAGNOSIS — D485 Neoplasm of uncertain behavior of skin: Secondary | ICD-10-CM | POA: Diagnosis not present

## 2023-02-04 DIAGNOSIS — J301 Allergic rhinitis due to pollen: Secondary | ICD-10-CM | POA: Diagnosis not present

## 2023-02-04 DIAGNOSIS — J3081 Allergic rhinitis due to animal (cat) (dog) hair and dander: Secondary | ICD-10-CM | POA: Diagnosis not present

## 2023-02-04 DIAGNOSIS — J3089 Other allergic rhinitis: Secondary | ICD-10-CM | POA: Diagnosis not present

## 2023-02-08 ENCOUNTER — Other Ambulatory Visit: Payer: Self-pay

## 2023-02-08 ENCOUNTER — Ambulatory Visit (INDEPENDENT_AMBULATORY_CARE_PROVIDER_SITE_OTHER): Payer: BC Managed Care – PPO | Admitting: Family Medicine

## 2023-02-08 ENCOUNTER — Encounter: Payer: Self-pay | Admitting: Family Medicine

## 2023-02-08 VITALS — BP 128/84 | Ht 64.0 in | Wt 145.0 lb

## 2023-02-08 DIAGNOSIS — M25511 Pain in right shoulder: Secondary | ICD-10-CM | POA: Diagnosis not present

## 2023-02-08 DIAGNOSIS — S62502D Fracture of unspecified phalanx of left thumb, subsequent encounter for fracture with routine healing: Secondary | ICD-10-CM | POA: Insufficient documentation

## 2023-02-08 DIAGNOSIS — M12811 Other specific arthropathies, not elsewhere classified, right shoulder: Secondary | ICD-10-CM

## 2023-02-08 DIAGNOSIS — M75101 Unspecified rotator cuff tear or rupture of right shoulder, not specified as traumatic: Secondary | ICD-10-CM

## 2023-02-08 DIAGNOSIS — S63641A Sprain of metacarpophalangeal joint of right thumb, initial encounter: Secondary | ICD-10-CM | POA: Diagnosis not present

## 2023-02-08 NOTE — Assessment & Plan Note (Signed)
-   Ultrasound findings show healing proximal UCL tear with some laxity.  She is not painful on exam with gapping of the joint. - She has been working on her strengthening and wearing the thumb spica brace which is likely helped maintain her range of motion and strength. - We advise continuing brace as above.  We will reevaluate in 4 weeks.

## 2023-02-08 NOTE — Assessment & Plan Note (Signed)
-   The patient injured her thumb in June and fortunately placed herself into a thumb spica using ice as well. - She did receive a steroid injection from a friend [facility] but is now having chronic pain. - Ultrasound findings show healing avulsion of the distal attachment of the EHB, with callus formation. - We advised that she continue the thumb spica splint at night and avoid repetitive flexion of the thumb. - She can continue some strengthening exercises using rubber bands or stress ball with straight fingers. - We advised avoiding tennis play for the next 4 weeks.  Will follow-up then to reassess.

## 2023-02-08 NOTE — Progress Notes (Unsigned)
Cynthia Pratt - 57 y.o. female MRN [id number]  Date of birth: 04/04/1965  PCP: Aliene Beams, MD  Subjective:  No chief complaint on file. Shoulder and thumb pain on the right  HPI: Past Medical, Surgical, Social, and Family History Reviewed & Updated per EMR.   Patient is a 57 y.o. female, RHD, here for RT shoulder pain that started in October after playing repetitive tennis bouts with little rest.  She had some initial pain in the anterior portion which has now subsided but wanted to make sure that she would be okay to return to tennis play.    She also fell on her right thumb in June with immediate pain and swelling over the area.  She put herself in a thumb spica and used ice with anti-inflammatories for 2 months.  She received a steroid injection in her [facility] from a friend which gave little relief.  She now has ongoing chronic pain and swelling with flexion of the thumb but no weakness.    No past surgical history on file.  Allergies  Allergen Reactions   Dust Mite Extract Other (See Comments)    Runny nose, itching Runny nose, itching    Kiwi Extract Itching and Other (See Comments)    Mouth sores and itching Mouth sores and itching    Oxycodone-Acetaminophen Nausea And Vomiting   Chlorhexidine Rash and Hives   Tape Rash        Objective:  Physical Exam: VS: BP:128/84  HR: bpm  TEMP: ( )  RESP:   HT:5\' 4"  (162.6 cm)   WT:145 lb (65.8 kg)  BMI:24.88  Gen: Well developed, NAD, speaks clearly, comfortable in exam room Respiratory: Normal work of breathing on room air, no respiratory distress Skin: No rashes, abrasions, or ecchymosis MSK:  Shoulder: RT shoulder Inspection no deformity ROM: Full in all directions Strength: 5/5 in all directions AC joint: Nontender Special tests:  Hawkins, Neer's negative but Neer's does produce some pain over the anterior biceps at peak flexion Crossover, lift off, belly press negative Speeds, Yergason's negative Empty can,  O'Briens negative Mild tenderness to palpation over the proximal biceps tendon  R thumb:  Inspection: soft tissue edema of the thenar eminence compared to left  ROM: full in all directions except for flexion at the MCP joint w/ some tenderness on the dorsal aspect  Strength: 5/5 including pincer grasp, opposition, adduction, abduction, and extension  Tenderness over the dorsal aspect of the proximal phalanx  NT w/ translation of the proximal phalanx or MC  NT to thenar eminence  No snuff box tenderness  Ulnar stress of the UCL does not elicit pain or increased motion compared to the left   Neuro: No sensory deficits.  Distally intact   Ultrasound of right shoulder: Biceps Tendon SAX and LAX: visualized in bicipital groove w/ trace hypoechoic changes surrounding the tendon distally, pectoralis and subscapularis insertions in tact. Subscapularis tendon - viewed in SAX and LAX inserting into the inferior lesser tubercle of humerus. echogenics: Some mild hypoechoic changes at the most distal insertion point but fibers intact AC joint -mild osteophyte formation, no giser sign Supraspinatus tendon - viewed in SAX and LAX, tendon in tact, insertion at superior facet of greater tubercle of the humerus w/ echogenics: hypoechoic changes within the tendon confirmed in both views Infraspinatus - viewed in LAX and SAX at the middle facet of the greater tubercle of humerus w/ some hypoechoic changes within the distal tendon Posterior glenohumeral joint visualized with no obvious  effusion  Summary: Partial tears of the supraspinatus, infraspinatus, and subscapularis  Ultrasound and interpretation by Dr. Webb Silversmith and Dr. Christella Hartigan   Limited ultrasound of the right thumb:  The right first [facility] shows some hyperechoic changes in the joint space with some mild degenerative changes The extensor carpi radialis is intact. The UCL shows some proximal hypoechoic fluid surrounding the attachment point with some laxity  of the fibers The first MCP shows some hypoechoic changes surrounding the EHB and callus formation over an incompletely attached bony fragment at the distal attachment point  Summary: Healing avulsion of the distal attachment of the [facility] and healing proximal UCL tear with some laxity  Ultrasound and interpretation by Dr. Webb Silversmith and Dr. Christella Hartigan    Assessment & Plan:   Rotator cuff tear arthropathy, right - The patient's history and exam as well as ultrasound findings showing partial tears of the supraspinatus, infraspinatus, and subscapularis are all consistent with rotator cuff tendinopathy. - Home exercises given for range of motion and strengthening - We advised avoiding tennis play for the next 2 weeks and then slowly reintroducing activity avoiding pain and with some modifications on backhand. - She can continue ice, Voltaren gel, and anti-inflammatories as needed - We will follow-up in 4 weeks to evaluate her progress  Avulsion fracture of left thumb with routine healing - The patient injured her thumb in June and fortunately placed herself into a thumb spica using ice as well. - She did receive a steroid injection from a friend in the first [facility] but is now having chronic pain. - Ultrasound findings show healing avulsion of the distal attachment of the EHB, with callus formation. - We advised that she continue the thumb spica splint at night and avoid repetitive flexion of the thumb. - She can continue some strengthening exercises using rubber bands or stress ball with straight fingers. - We advised avoiding tennis play for the next 4 weeks.  Will follow-up then to reassess.  Rupture of UCL of right thumb - Ultrasound findings show healing proximal UCL tear with some laxity.  She is not painful on exam with gapping of the joint. - She has been working on her strengthening and wearing the thumb spica brace which is likely helped maintain her range of motion and strength. - We advise  continuing brace as above.  We will reevaluate in 4 weeks.    Rica Mote MD [facility] Sports Medicine Fellow   Addendum:  Patient seen and examined in the office with fellow.   History, exam, ultrasound images, plan of care were precepted with me.  Agree with findings as documented in fellow note with additions/modifications as noted above.  Darene Lamer, DO, CAQSM

## 2023-02-08 NOTE — Assessment & Plan Note (Signed)
-   The patient's history and exam as well as ultrasound findings showing partial tears of the supraspinatus, infraspinatus, and subscapularis are all consistent with rotator cuff tendinopathy. - Home exercises given for range of motion and strengthening - We advised avoiding tennis play for the next 2 weeks and then slowly reintroducing activity avoiding pain and with some modifications on backhand. - She can continue ice, Voltaren gel, and anti-inflammatories as needed - We will follow-up in 4 weeks to evaluate her progress

## 2023-02-10 DIAGNOSIS — J3089 Other allergic rhinitis: Secondary | ICD-10-CM | POA: Diagnosis not present

## 2023-02-10 DIAGNOSIS — J3081 Allergic rhinitis due to animal (cat) (dog) hair and dander: Secondary | ICD-10-CM | POA: Diagnosis not present

## 2023-02-15 DIAGNOSIS — J3081 Allergic rhinitis due to animal (cat) (dog) hair and dander: Secondary | ICD-10-CM | POA: Diagnosis not present

## 2023-02-15 DIAGNOSIS — J3089 Other allergic rhinitis: Secondary | ICD-10-CM | POA: Diagnosis not present

## 2023-02-15 DIAGNOSIS — J301 Allergic rhinitis due to pollen: Secondary | ICD-10-CM | POA: Diagnosis not present

## 2023-02-15 DIAGNOSIS — M2011 Hallux valgus (acquired), right foot: Secondary | ICD-10-CM | POA: Diagnosis not present

## 2023-02-22 DIAGNOSIS — J3089 Other allergic rhinitis: Secondary | ICD-10-CM | POA: Diagnosis not present

## 2023-02-22 DIAGNOSIS — J3081 Allergic rhinitis due to animal (cat) (dog) hair and dander: Secondary | ICD-10-CM | POA: Diagnosis not present

## 2023-02-28 DIAGNOSIS — G4733 Obstructive sleep apnea (adult) (pediatric): Secondary | ICD-10-CM | POA: Diagnosis not present

## 2023-03-03 DIAGNOSIS — J3089 Other allergic rhinitis: Secondary | ICD-10-CM | POA: Diagnosis not present

## 2023-03-03 DIAGNOSIS — G4733 Obstructive sleep apnea (adult) (pediatric): Secondary | ICD-10-CM | POA: Diagnosis not present

## 2023-03-03 DIAGNOSIS — J3081 Allergic rhinitis due to animal (cat) (dog) hair and dander: Secondary | ICD-10-CM | POA: Diagnosis not present

## 2023-03-07 ENCOUNTER — Encounter: Payer: Self-pay | Admitting: Family Medicine

## 2023-03-07 ENCOUNTER — Ambulatory Visit (INDEPENDENT_AMBULATORY_CARE_PROVIDER_SITE_OTHER): Payer: BC Managed Care – PPO | Admitting: Family Medicine

## 2023-03-07 VITALS — BP 118/82 | Ht 64.0 in | Wt 145.0 lb

## 2023-03-07 DIAGNOSIS — S62501D Fracture of unspecified phalanx of right thumb, subsequent encounter for fracture with routine healing: Secondary | ICD-10-CM

## 2023-03-07 DIAGNOSIS — S63641D Sprain of metacarpophalangeal joint of right thumb, subsequent encounter: Secondary | ICD-10-CM

## 2023-03-07 DIAGNOSIS — M67911 Unspecified disorder of synovium and tendon, right shoulder: Secondary | ICD-10-CM

## 2023-03-07 NOTE — Assessment & Plan Note (Signed)
Greatly improved right thumb pain with signs of healing avulsion and UCL sprain at the thumb status post injury 09/07/2022  Plan: -Okay for her to stop using the thumb brace on a regular basis.  Can continue to use at bedtime as needed -Can ice as needed -Voltaren gel as needed -Reviewed home exercises for strengthening of the hand/thumb muscles -Discussed return to tennis activities, she is currently off the schedule through the end of the month.  She plans to rest from tennis throughout December, then gradually return to activity.  Okay for her to advance tennis activities as tolerated -Follow-up as needed

## 2023-03-07 NOTE — Progress Notes (Signed)
DATE OF VISIT: 03/07/2023        Cynthia Pratt DOB: 12/26/1965 MRN: [id number]  CC:  f/u Rt thumb and Rt shoulder  History of present Illness: Cynthia Pratt is a 57 y.o. female who presents for a follow-up visit for Rt thumb and Rt shoulder Last seen by me 02/08/23  RE: Rt thumb Last visit noted to have healing avulsion fx of the thumb, as well as UCL sprain Injury ongoing since 09/07/22 Has been wearing thumb loop brace regularly since our last visit Notes decreased swelling Still some pain Notes some trouble when trying to bend the thumb Has not tried playing tennis Has not been doing any exercises for the thumb  RE: Rt shoulder Has been improving Still some pain in the anterior shoulder with overhead activity or if reaching behind her back No radiation of the pain Has been doing [facility] PT program with [employer] that was provided by her insurance - doing this every other day - some exercises with repetitive overhead motions are uncomfortable Has not tried playing tennis  Medications:  Outpatient Encounter Medications as of 03/07/2023  Medication Sig   albuterol (VENTOLIN HFA) 108 (90 Base) MCG/ACT inhaler Inhale 2 puffs into the lungs every 4 (four) hours as needed.   Azelastine-Fluticasone 137-50 MCG/ACT SUSP Place 1 spray into both nostrils 2 (two) times daily.   buPROPion (WELLBUTRIN XL) 150 MG 24 hr tablet Take 150 mg by mouth every morning.   buPROPion (WELLBUTRIN XL) 300 MG 24 hr tablet Take 300 mg by mouth daily.   calcium-vitamin D (OSCAL WITH D) 500-200 MG-UNIT TABS tablet Take by mouth.   cloNIDine (CATAPRES) 0.1 MG tablet Take 0.05 mg by mouth at bedtime.   EPINEPHrine 0.3 mg/0.3 mL IJ SOAJ injection Inject into the muscle as directed.   estradiol (ESTRACE) 0.1 MG/GM vaginal cream Place vaginally.   levocetirizine (XYZAL) 5 MG tablet Take 5 mg by mouth daily.   Multiple Vitamins-Minerals (DAILY MULTIVITAMIN) CAPS Take by mouth.   No facility-administered encounter  medications on file as of 03/07/2023.    Allergies: is allergic to dust mite extract, kiwi extract, oxycodone-acetaminophen, chlorhexidine, and tape.  Physical Examination: Vitals: BP 118/82   Ht 5\' 4"  (1.626 m)   Wt 145 lb (65.8 kg)   BMI 24.89 kg/m  GENERAL:  Cynthia Pratt is a 57 y.o. female appearing their stated age, alert and oriented x 3, in no apparent distress.  SKIN: no rashes or lesions, skin clean, dry, intact MSK:  Right shoulder with minimal pain at terminal flexion, abduction, internal rotation.  She has minimal tenderness to palpation over the bicipital groove, no tenderness over the [facility] joint or greater tuberosity.  Negative Hawkins, negative Neer, negative speeds, negative empty can.  Rotator cuff strength 5/5 throughout. Left shoulder with full range of motion without pain, weakness, instability  Right thumb without any significant soft tissue swelling.  Has minimal tenderness palpation along the MCP joint of the thumb.  Has slightly limited active flexion of the thumb, but normal full passive range of motion without pain.  No pain with UCL stress at 0 and 30 degrees.  She has normal grip strength. Normal left thumb exam  Neurovascularly intact distally  Assessment & Plan Tendinopathy of right rotator cuff Right rotator cuff tendinopathy with previous ultrasound 02/08/2023 showing partial tears of the supraspinatus, infraspinatus, subscapularis.  She has slight pain with repetitive overhead activities currently, but exam was otherwise unremarkable  Plan: -Reassurance provided that she is making good progress.  Should try to limit repetitive overhead activities.  Did discuss ways that she can adjust her home PT program that she is doing -Continue ice as needed -Continue Voltaren gel as needed -Discussed return to tennis activities, she is currently off the schedule through the end of the month.  She plans to rest from tennis throughout December, that gradually return  to activity.  Did advise that serving motion and overhead's will likely be the most problematic.  Is okay to continue to play as long she is not having increasing or worsening pain -She can follow-up with [place] on an as-needed basis, encouraged to reach out with any questions or concerns Closed avulsion fracture of phalanx of right thumb with routine healing, subsequent encounter Greatly improved right thumb pain with signs of healing avulsion and UCL sprain at the thumb status post injury 09/07/2022  Plan: -Okay for her to stop using the thumb brace on a regular basis.  Can continue to use at bedtime as needed -Can ice as needed -Voltaren gel as needed -Reviewed home exercises for strengthening of the hand/thumb muscles -Discussed return to tennis activities, she is currently off the schedule through the end of the month.  She plans to rest from tennis throughout December, then gradually return to activity.  Okay for her to advance tennis activities as tolerated -Follow-up as needed Rupture of ulnar collateral ligament of right thumb, subsequent encounter Greatly improved right thumb pain with healing UCL sprain status post injury 09/07/2022  Plan: -Okay for her to stop using the thumb brace on a regular basis.  Can continue to use at bedtime as needed -Can ice as needed -Voltaren gel as needed -Reviewed home exercises for strengthening of the hand/thumb muscles -Discussed return to tennis activities, she is currently off the schedule through the end of the month.  She plans to rest from tennis throughout December, then gradually return to activity.  Okay for her to advance tennis activities as tolerated -Follow-up as needed   Patient expressed understanding & agreement with above.  Encounter Diagnoses  Name Primary?   Tendinopathy of right rotator cuff Yes   Closed avulsion fracture of phalanx of right thumb with routine healing, subsequent encounter    Rupture of ulnar collateral ligament  of right thumb, subsequent encounter     No orders of the defined types were placed in this encounter.

## 2023-03-07 NOTE — Assessment & Plan Note (Signed)
Greatly improved right thumb pain with healing UCL sprain status post injury 09/07/2022  Plan: -Okay for her to stop using the thumb brace on a regular basis.  Can continue to use at bedtime as needed -Can ice as needed -Voltaren gel as needed -Reviewed home exercises for strengthening of the hand/thumb muscles -Discussed return to tennis activities, she is currently off the schedule through the end of the month.  She plans to rest from tennis throughout December, then gradually return to activity.  Okay for her to advance tennis activities as tolerated -Follow-up as needed

## 2023-03-10 DIAGNOSIS — J3081 Allergic rhinitis due to animal (cat) (dog) hair and dander: Secondary | ICD-10-CM | POA: Diagnosis not present

## 2023-03-10 DIAGNOSIS — J3089 Other allergic rhinitis: Secondary | ICD-10-CM | POA: Diagnosis not present

## 2023-03-15 DIAGNOSIS — J3089 Other allergic rhinitis: Secondary | ICD-10-CM | POA: Diagnosis not present

## 2023-03-15 DIAGNOSIS — J3081 Allergic rhinitis due to animal (cat) (dog) hair and dander: Secondary | ICD-10-CM | POA: Diagnosis not present

## 2023-03-21 DIAGNOSIS — M25511 Pain in right shoulder: Secondary | ICD-10-CM | POA: Diagnosis not present

## 2023-03-24 ENCOUNTER — Other Ambulatory Visit: Payer: Self-pay | Admitting: Orthopedic Surgery

## 2023-03-24 DIAGNOSIS — J3089 Other allergic rhinitis: Secondary | ICD-10-CM | POA: Diagnosis not present

## 2023-03-24 DIAGNOSIS — G8929 Other chronic pain: Secondary | ICD-10-CM

## 2023-03-24 DIAGNOSIS — J3081 Allergic rhinitis due to animal (cat) (dog) hair and dander: Secondary | ICD-10-CM | POA: Diagnosis not present

## 2023-03-31 DIAGNOSIS — J3089 Other allergic rhinitis: Secondary | ICD-10-CM | POA: Diagnosis not present

## 2023-03-31 DIAGNOSIS — G4733 Obstructive sleep apnea (adult) (pediatric): Secondary | ICD-10-CM | POA: Diagnosis not present

## 2023-03-31 DIAGNOSIS — J3081 Allergic rhinitis due to animal (cat) (dog) hair and dander: Secondary | ICD-10-CM | POA: Diagnosis not present

## 2023-04-07 DIAGNOSIS — J3081 Allergic rhinitis due to animal (cat) (dog) hair and dander: Secondary | ICD-10-CM | POA: Diagnosis not present

## 2023-04-07 DIAGNOSIS — J3089 Other allergic rhinitis: Secondary | ICD-10-CM | POA: Diagnosis not present

## 2023-04-07 DIAGNOSIS — J301 Allergic rhinitis due to pollen: Secondary | ICD-10-CM | POA: Diagnosis not present

## 2023-04-10 ENCOUNTER — Other Ambulatory Visit: Payer: BC Managed Care – PPO

## 2023-04-14 DIAGNOSIS — J3089 Other allergic rhinitis: Secondary | ICD-10-CM | POA: Diagnosis not present

## 2023-04-14 DIAGNOSIS — J301 Allergic rhinitis due to pollen: Secondary | ICD-10-CM | POA: Diagnosis not present

## 2023-04-14 DIAGNOSIS — J3081 Allergic rhinitis due to animal (cat) (dog) hair and dander: Secondary | ICD-10-CM | POA: Diagnosis not present

## 2023-04-22 DIAGNOSIS — J3081 Allergic rhinitis due to animal (cat) (dog) hair and dander: Secondary | ICD-10-CM | POA: Diagnosis not present

## 2023-04-22 DIAGNOSIS — J3089 Other allergic rhinitis: Secondary | ICD-10-CM | POA: Diagnosis not present

## 2023-04-28 DIAGNOSIS — J3089 Other allergic rhinitis: Secondary | ICD-10-CM | POA: Diagnosis not present

## 2023-04-28 DIAGNOSIS — J3081 Allergic rhinitis due to animal (cat) (dog) hair and dander: Secondary | ICD-10-CM | POA: Diagnosis not present

## 2023-05-01 DIAGNOSIS — G4733 Obstructive sleep apnea (adult) (pediatric): Secondary | ICD-10-CM | POA: Diagnosis not present

## 2023-05-02 ENCOUNTER — Encounter: Payer: Self-pay | Admitting: Orthopedic Surgery

## 2023-05-05 DIAGNOSIS — J3081 Allergic rhinitis due to animal (cat) (dog) hair and dander: Secondary | ICD-10-CM | POA: Diagnosis not present

## 2023-05-05 DIAGNOSIS — J301 Allergic rhinitis due to pollen: Secondary | ICD-10-CM | POA: Diagnosis not present

## 2023-05-05 DIAGNOSIS — J3089 Other allergic rhinitis: Secondary | ICD-10-CM | POA: Diagnosis not present

## 2023-05-07 ENCOUNTER — Ambulatory Visit
Admission: RE | Admit: 2023-05-07 | Discharge: 2023-05-07 | Disposition: A | Discharge status: Home / Self Care | Payer: BC Managed Care – PPO | Source: Ambulatory Visit | Attending: Orthopedic Surgery | Admitting: Orthopedic Surgery

## 2023-05-07 DIAGNOSIS — G8929 Other chronic pain: Secondary | ICD-10-CM

## 2023-05-07 DIAGNOSIS — M25511 Pain in right shoulder: Secondary | ICD-10-CM | POA: Diagnosis not present

## 2023-05-12 DIAGNOSIS — J3081 Allergic rhinitis due to animal (cat) (dog) hair and dander: Secondary | ICD-10-CM | POA: Diagnosis not present

## 2023-05-12 DIAGNOSIS — J3089 Other allergic rhinitis: Secondary | ICD-10-CM | POA: Diagnosis not present

## 2023-05-16 DIAGNOSIS — M21622 Bunionette of left foot: Secondary | ICD-10-CM | POA: Diagnosis not present

## 2023-05-20 DIAGNOSIS — J3089 Other allergic rhinitis: Secondary | ICD-10-CM | POA: Diagnosis not present

## 2023-05-20 DIAGNOSIS — J4520 Mild intermittent asthma, uncomplicated: Secondary | ICD-10-CM | POA: Diagnosis not present

## 2023-05-20 DIAGNOSIS — J301 Allergic rhinitis due to pollen: Secondary | ICD-10-CM | POA: Diagnosis not present

## 2023-05-20 DIAGNOSIS — J3081 Allergic rhinitis due to animal (cat) (dog) hair and dander: Secondary | ICD-10-CM | POA: Diagnosis not present

## 2023-05-20 DIAGNOSIS — J300 Vasomotor rhinitis: Secondary | ICD-10-CM | POA: Diagnosis not present

## 2023-05-23 DIAGNOSIS — M75111 Incomplete rotator cuff tear or rupture of right shoulder, not specified as traumatic: Secondary | ICD-10-CM | POA: Diagnosis not present

## 2023-05-25 DIAGNOSIS — J3081 Allergic rhinitis due to animal (cat) (dog) hair and dander: Secondary | ICD-10-CM | POA: Diagnosis not present

## 2023-05-25 DIAGNOSIS — J301 Allergic rhinitis due to pollen: Secondary | ICD-10-CM | POA: Diagnosis not present

## 2023-05-25 DIAGNOSIS — M25511 Pain in right shoulder: Secondary | ICD-10-CM | POA: Diagnosis not present

## 2023-05-25 DIAGNOSIS — R531 Weakness: Secondary | ICD-10-CM | POA: Diagnosis not present

## 2023-05-25 DIAGNOSIS — J3089 Other allergic rhinitis: Secondary | ICD-10-CM | POA: Diagnosis not present

## 2023-05-26 DIAGNOSIS — J3081 Allergic rhinitis due to animal (cat) (dog) hair and dander: Secondary | ICD-10-CM | POA: Diagnosis not present

## 2023-05-26 DIAGNOSIS — J3089 Other allergic rhinitis: Secondary | ICD-10-CM | POA: Diagnosis not present

## 2023-05-26 DIAGNOSIS — M25511 Pain in right shoulder: Secondary | ICD-10-CM | POA: Diagnosis not present

## 2023-05-26 DIAGNOSIS — R531 Weakness: Secondary | ICD-10-CM | POA: Diagnosis not present

## 2023-05-29 DIAGNOSIS — G4733 Obstructive sleep apnea (adult) (pediatric): Secondary | ICD-10-CM | POA: Diagnosis not present

## 2023-05-31 DIAGNOSIS — M25511 Pain in right shoulder: Secondary | ICD-10-CM | POA: Diagnosis not present

## 2023-05-31 DIAGNOSIS — R531 Weakness: Secondary | ICD-10-CM | POA: Diagnosis not present

## 2023-06-01 DIAGNOSIS — G4733 Obstructive sleep apnea (adult) (pediatric): Secondary | ICD-10-CM | POA: Diagnosis not present

## 2023-06-03 DIAGNOSIS — J3081 Allergic rhinitis due to animal (cat) (dog) hair and dander: Secondary | ICD-10-CM | POA: Diagnosis not present

## 2023-06-03 DIAGNOSIS — J3089 Other allergic rhinitis: Secondary | ICD-10-CM | POA: Diagnosis not present

## 2023-06-03 DIAGNOSIS — M25511 Pain in right shoulder: Secondary | ICD-10-CM | POA: Diagnosis not present

## 2023-06-03 DIAGNOSIS — J301 Allergic rhinitis due to pollen: Secondary | ICD-10-CM | POA: Diagnosis not present

## 2023-06-03 DIAGNOSIS — R531 Weakness: Secondary | ICD-10-CM | POA: Diagnosis not present

## 2023-06-07 DIAGNOSIS — M25511 Pain in right shoulder: Secondary | ICD-10-CM | POA: Diagnosis not present

## 2023-06-07 DIAGNOSIS — R531 Weakness: Secondary | ICD-10-CM | POA: Diagnosis not present

## 2023-06-10 DIAGNOSIS — J3089 Other allergic rhinitis: Secondary | ICD-10-CM | POA: Diagnosis not present

## 2023-06-10 DIAGNOSIS — J301 Allergic rhinitis due to pollen: Secondary | ICD-10-CM | POA: Diagnosis not present

## 2023-06-10 DIAGNOSIS — M25511 Pain in right shoulder: Secondary | ICD-10-CM | POA: Diagnosis not present

## 2023-06-10 DIAGNOSIS — J3081 Allergic rhinitis due to animal (cat) (dog) hair and dander: Secondary | ICD-10-CM | POA: Diagnosis not present

## 2023-06-10 DIAGNOSIS — R531 Weakness: Secondary | ICD-10-CM | POA: Diagnosis not present

## 2023-06-14 DIAGNOSIS — R531 Weakness: Secondary | ICD-10-CM | POA: Diagnosis not present

## 2023-06-14 DIAGNOSIS — M25511 Pain in right shoulder: Secondary | ICD-10-CM | POA: Diagnosis not present

## 2023-06-17 DIAGNOSIS — J3089 Other allergic rhinitis: Secondary | ICD-10-CM | POA: Diagnosis not present

## 2023-06-17 DIAGNOSIS — M25511 Pain in right shoulder: Secondary | ICD-10-CM | POA: Diagnosis not present

## 2023-06-17 DIAGNOSIS — J3081 Allergic rhinitis due to animal (cat) (dog) hair and dander: Secondary | ICD-10-CM | POA: Diagnosis not present

## 2023-06-17 DIAGNOSIS — R531 Weakness: Secondary | ICD-10-CM | POA: Diagnosis not present

## 2023-06-17 DIAGNOSIS — J301 Allergic rhinitis due to pollen: Secondary | ICD-10-CM | POA: Diagnosis not present

## 2023-06-21 DIAGNOSIS — R531 Weakness: Secondary | ICD-10-CM | POA: Diagnosis not present

## 2023-06-21 DIAGNOSIS — M25511 Pain in right shoulder: Secondary | ICD-10-CM | POA: Diagnosis not present

## 2023-06-21 IMAGING — CR DG FOOT COMPLETE 3+V*R*
3 series · 3 of 3 positions shown · non-contrast
Comparison: None.

CLINICAL DATA: Right foot pain for 2 months.

EXAM:
RIGHT FOOT COMPLETE - 3+ VIEW

[t foot ap right]
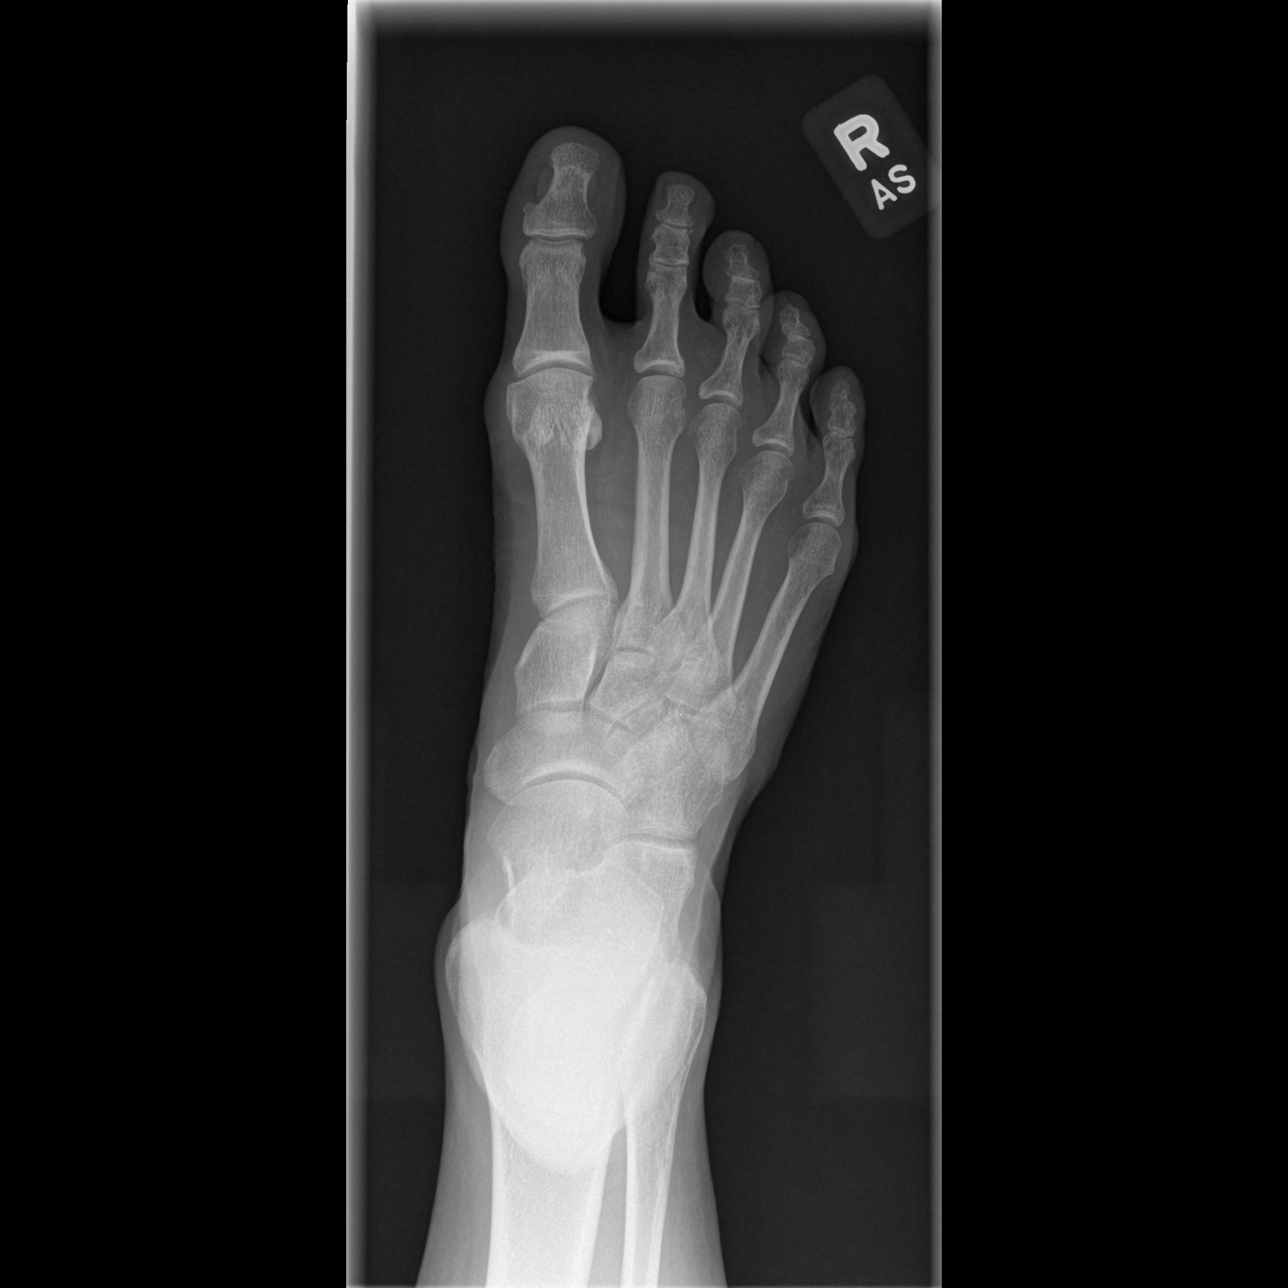

[t foot oblique right *]
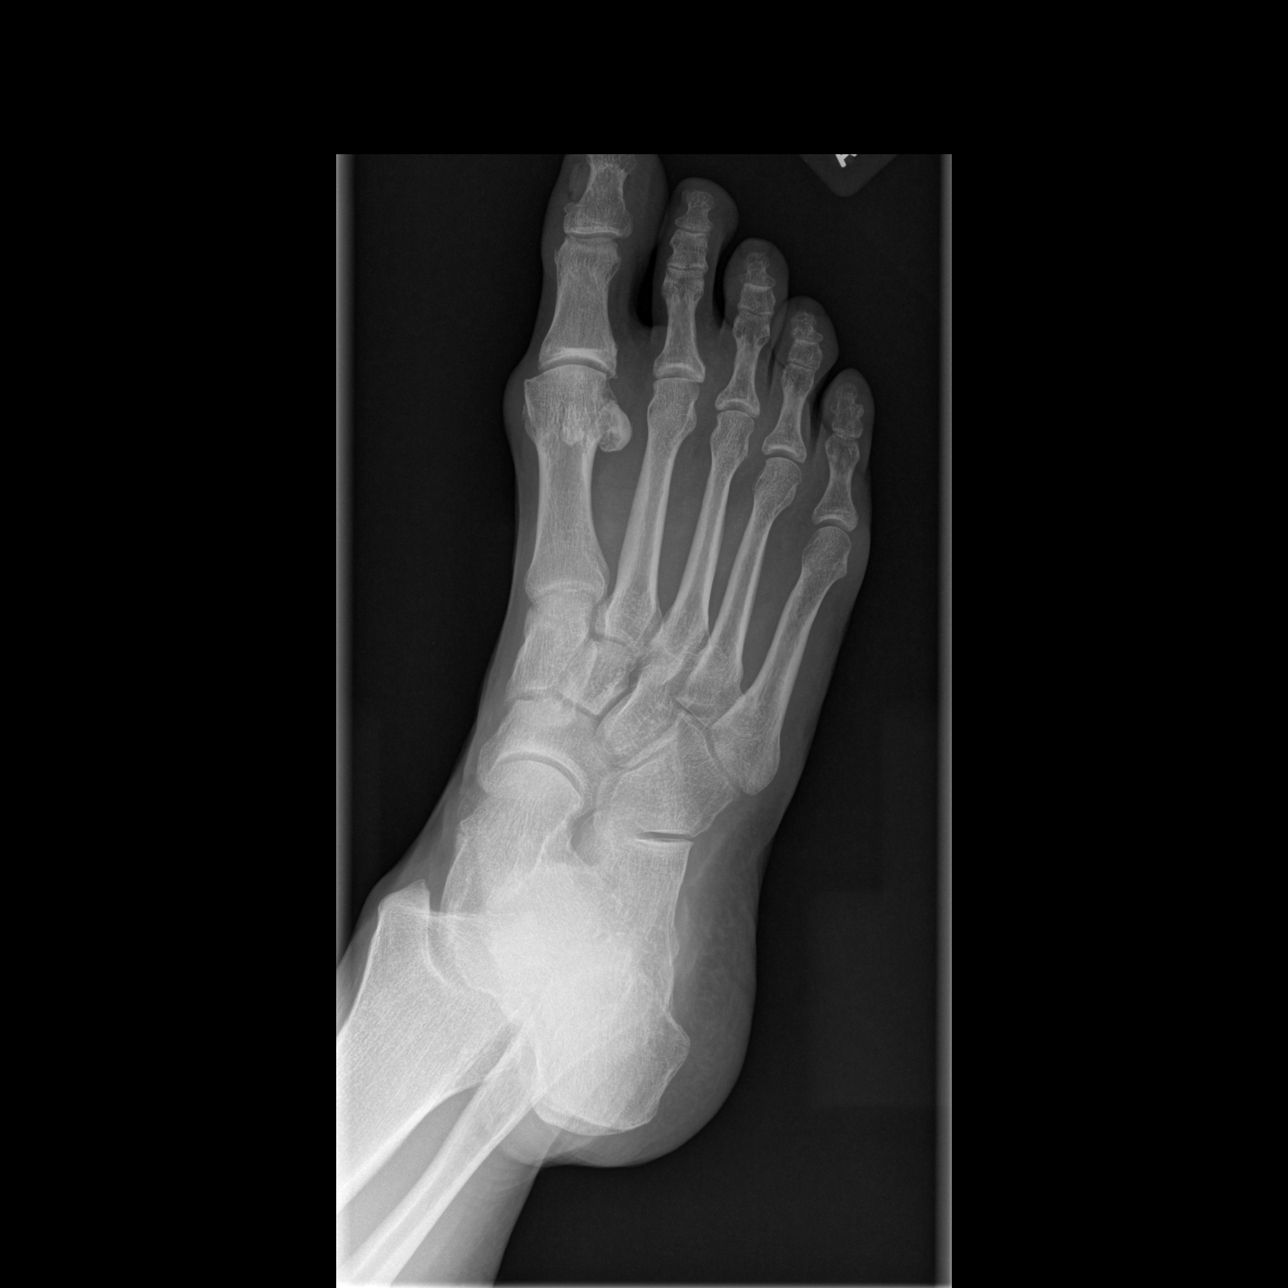

[t foot lat right *]
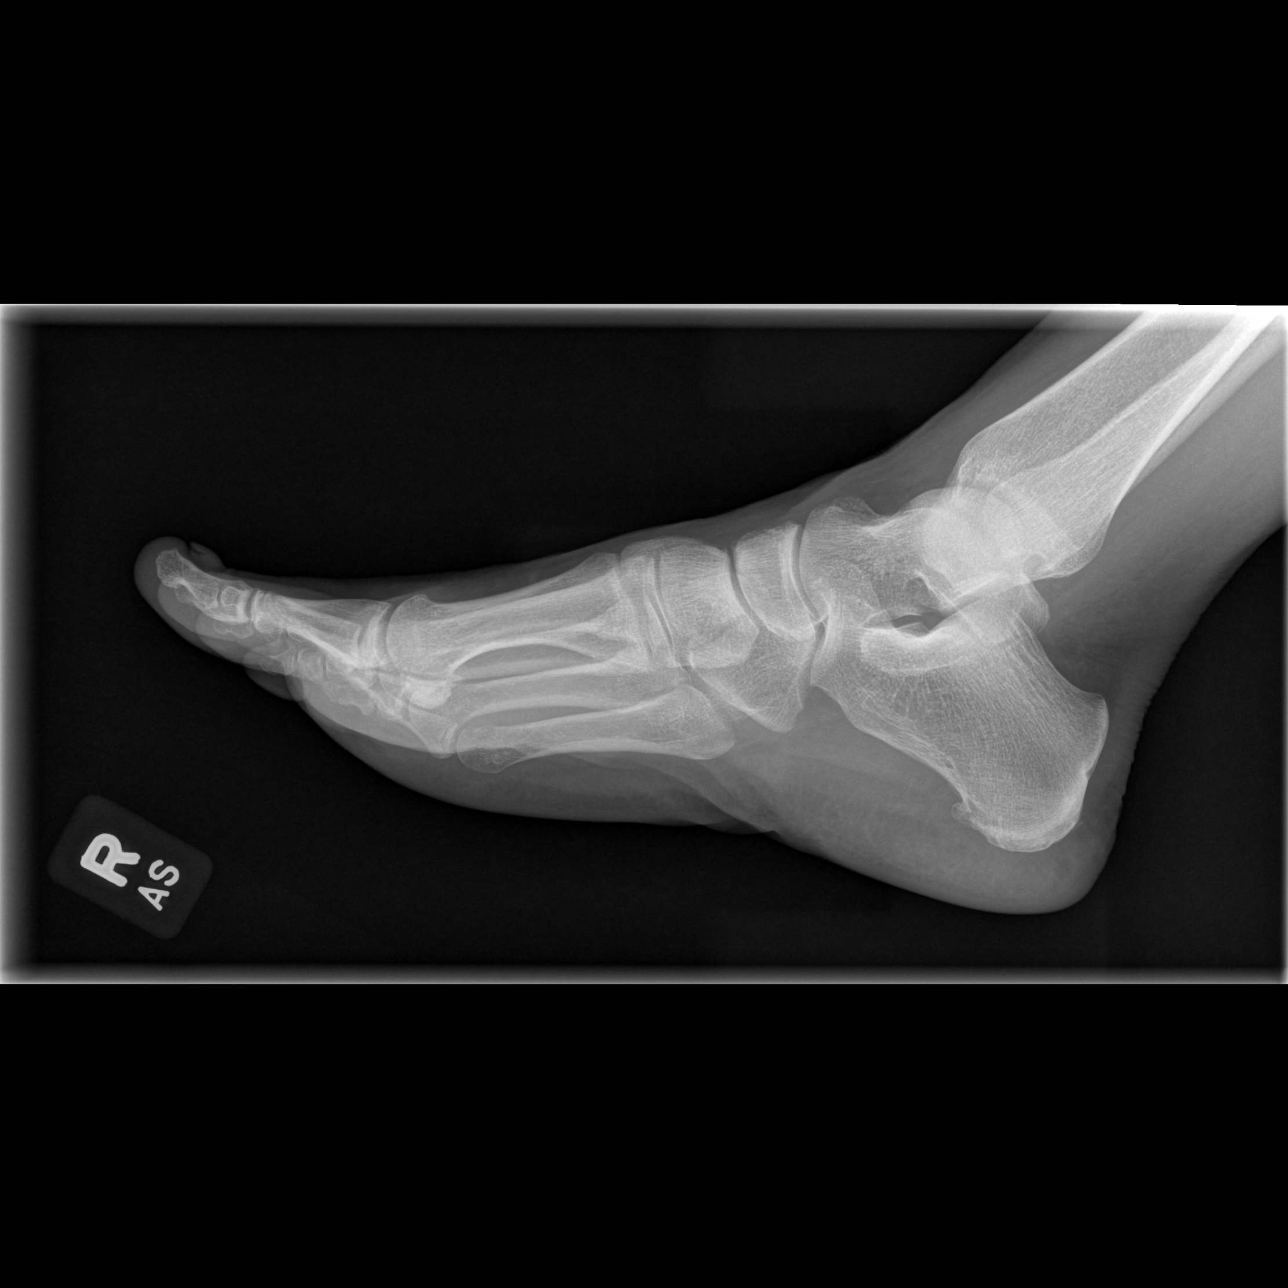

[3 of 3 positions shown; findings below may reference images not displayed]

FINDINGS: There is no evidence of fracture or dislocation. No evidence of
avascular necrosis or flattening the metatarsal heads. Mild
degenerative spurring is seen involving the 1st MTP joint. Small
plantar calcaneal bone spur also noted. Soft tissues are
unremarkable.
IMPRESSION: No acute findings.

Mild 1st MTP joint degenerative spurring.

Small plantar calcaneal bone spur.

## 2023-06-24 DIAGNOSIS — J301 Allergic rhinitis due to pollen: Secondary | ICD-10-CM | POA: Diagnosis not present

## 2023-06-24 DIAGNOSIS — J3089 Other allergic rhinitis: Secondary | ICD-10-CM | POA: Diagnosis not present

## 2023-06-24 DIAGNOSIS — R531 Weakness: Secondary | ICD-10-CM | POA: Diagnosis not present

## 2023-06-24 DIAGNOSIS — M25511 Pain in right shoulder: Secondary | ICD-10-CM | POA: Diagnosis not present

## 2023-06-24 DIAGNOSIS — J3081 Allergic rhinitis due to animal (cat) (dog) hair and dander: Secondary | ICD-10-CM | POA: Diagnosis not present

## 2023-06-30 DIAGNOSIS — J301 Allergic rhinitis due to pollen: Secondary | ICD-10-CM | POA: Diagnosis not present

## 2023-06-30 DIAGNOSIS — J3081 Allergic rhinitis due to animal (cat) (dog) hair and dander: Secondary | ICD-10-CM | POA: Diagnosis not present

## 2023-06-30 DIAGNOSIS — J3089 Other allergic rhinitis: Secondary | ICD-10-CM | POA: Diagnosis not present

## 2023-07-07 DIAGNOSIS — J301 Allergic rhinitis due to pollen: Secondary | ICD-10-CM | POA: Diagnosis not present

## 2023-07-07 DIAGNOSIS — J3081 Allergic rhinitis due to animal (cat) (dog) hair and dander: Secondary | ICD-10-CM | POA: Diagnosis not present

## 2023-07-07 DIAGNOSIS — J3089 Other allergic rhinitis: Secondary | ICD-10-CM | POA: Diagnosis not present

## 2023-07-14 DIAGNOSIS — J3081 Allergic rhinitis due to animal (cat) (dog) hair and dander: Secondary | ICD-10-CM | POA: Diagnosis not present

## 2023-07-14 DIAGNOSIS — J3089 Other allergic rhinitis: Secondary | ICD-10-CM | POA: Diagnosis not present

## 2023-07-21 DIAGNOSIS — J3089 Other allergic rhinitis: Secondary | ICD-10-CM | POA: Diagnosis not present

## 2023-07-21 DIAGNOSIS — J3081 Allergic rhinitis due to animal (cat) (dog) hair and dander: Secondary | ICD-10-CM | POA: Diagnosis not present

## 2023-07-29 DIAGNOSIS — J3089 Other allergic rhinitis: Secondary | ICD-10-CM | POA: Diagnosis not present

## 2023-07-29 DIAGNOSIS — J3081 Allergic rhinitis due to animal (cat) (dog) hair and dander: Secondary | ICD-10-CM | POA: Diagnosis not present

## 2023-07-29 DIAGNOSIS — G4733 Obstructive sleep apnea (adult) (pediatric): Secondary | ICD-10-CM | POA: Diagnosis not present

## 2023-07-29 DIAGNOSIS — J301 Allergic rhinitis due to pollen: Secondary | ICD-10-CM | POA: Diagnosis not present

## 2023-08-01 DIAGNOSIS — Z6825 Body mass index (BMI) 25.0-25.9, adult: Secondary | ICD-10-CM | POA: Diagnosis not present

## 2023-08-01 DIAGNOSIS — Z01419 Encounter for gynecological examination (general) (routine) without abnormal findings: Secondary | ICD-10-CM | POA: Diagnosis not present

## 2023-08-04 DIAGNOSIS — J3081 Allergic rhinitis due to animal (cat) (dog) hair and dander: Secondary | ICD-10-CM | POA: Diagnosis not present

## 2023-08-04 DIAGNOSIS — J301 Allergic rhinitis due to pollen: Secondary | ICD-10-CM | POA: Diagnosis not present

## 2023-08-04 DIAGNOSIS — J3089 Other allergic rhinitis: Secondary | ICD-10-CM | POA: Diagnosis not present

## 2023-08-11 DIAGNOSIS — L738 Other specified follicular disorders: Secondary | ICD-10-CM | POA: Diagnosis not present

## 2023-08-11 DIAGNOSIS — L905 Scar conditions and fibrosis of skin: Secondary | ICD-10-CM | POA: Diagnosis not present

## 2023-08-12 DIAGNOSIS — J3081 Allergic rhinitis due to animal (cat) (dog) hair and dander: Secondary | ICD-10-CM | POA: Diagnosis not present

## 2023-08-12 DIAGNOSIS — J3089 Other allergic rhinitis: Secondary | ICD-10-CM | POA: Diagnosis not present

## 2023-08-18 DIAGNOSIS — J301 Allergic rhinitis due to pollen: Secondary | ICD-10-CM | POA: Diagnosis not present

## 2023-08-18 DIAGNOSIS — J3081 Allergic rhinitis due to animal (cat) (dog) hair and dander: Secondary | ICD-10-CM | POA: Diagnosis not present

## 2023-08-18 DIAGNOSIS — J3089 Other allergic rhinitis: Secondary | ICD-10-CM | POA: Diagnosis not present

## 2023-08-25 DIAGNOSIS — J3089 Other allergic rhinitis: Secondary | ICD-10-CM | POA: Diagnosis not present

## 2023-08-25 DIAGNOSIS — J3081 Allergic rhinitis due to animal (cat) (dog) hair and dander: Secondary | ICD-10-CM | POA: Diagnosis not present

## 2023-08-27 DIAGNOSIS — G4733 Obstructive sleep apnea (adult) (pediatric): Secondary | ICD-10-CM | POA: Diagnosis not present

## 2023-09-01 DIAGNOSIS — J3081 Allergic rhinitis due to animal (cat) (dog) hair and dander: Secondary | ICD-10-CM | POA: Diagnosis not present

## 2023-09-01 DIAGNOSIS — J301 Allergic rhinitis due to pollen: Secondary | ICD-10-CM | POA: Diagnosis not present

## 2023-09-01 DIAGNOSIS — J3089 Other allergic rhinitis: Secondary | ICD-10-CM | POA: Diagnosis not present

## 2023-09-02 DIAGNOSIS — G4733 Obstructive sleep apnea (adult) (pediatric): Secondary | ICD-10-CM | POA: Diagnosis not present

## 2023-09-08 DIAGNOSIS — J3081 Allergic rhinitis due to animal (cat) (dog) hair and dander: Secondary | ICD-10-CM | POA: Diagnosis not present

## 2023-09-08 DIAGNOSIS — J3089 Other allergic rhinitis: Secondary | ICD-10-CM | POA: Diagnosis not present

## 2023-09-14 DIAGNOSIS — Z961 Presence of intraocular lens: Secondary | ICD-10-CM | POA: Diagnosis not present

## 2023-09-14 DIAGNOSIS — H04123 Dry eye syndrome of bilateral lacrimal glands: Secondary | ICD-10-CM | POA: Diagnosis not present

## 2023-09-14 DIAGNOSIS — H43812 Vitreous degeneration, left eye: Secondary | ICD-10-CM | POA: Diagnosis not present

## 2023-09-14 DIAGNOSIS — J3081 Allergic rhinitis due to animal (cat) (dog) hair and dander: Secondary | ICD-10-CM | POA: Diagnosis not present

## 2023-09-14 DIAGNOSIS — J301 Allergic rhinitis due to pollen: Secondary | ICD-10-CM | POA: Diagnosis not present

## 2023-09-14 DIAGNOSIS — H31001 Unspecified chorioretinal scars, right eye: Secondary | ICD-10-CM | POA: Diagnosis not present

## 2023-09-14 DIAGNOSIS — J3089 Other allergic rhinitis: Secondary | ICD-10-CM | POA: Diagnosis not present

## 2023-09-14 DIAGNOSIS — H524 Presbyopia: Secondary | ICD-10-CM | POA: Diagnosis not present

## 2023-09-21 DIAGNOSIS — J3089 Other allergic rhinitis: Secondary | ICD-10-CM | POA: Diagnosis not present

## 2023-09-22 DIAGNOSIS — J300 Vasomotor rhinitis: Secondary | ICD-10-CM | POA: Diagnosis not present

## 2023-09-22 DIAGNOSIS — J3089 Other allergic rhinitis: Secondary | ICD-10-CM | POA: Diagnosis not present

## 2023-09-22 DIAGNOSIS — J301 Allergic rhinitis due to pollen: Secondary | ICD-10-CM | POA: Diagnosis not present

## 2023-09-22 DIAGNOSIS — J3081 Allergic rhinitis due to animal (cat) (dog) hair and dander: Secondary | ICD-10-CM | POA: Diagnosis not present

## 2023-09-29 DIAGNOSIS — J301 Allergic rhinitis due to pollen: Secondary | ICD-10-CM | POA: Diagnosis not present

## 2023-09-29 DIAGNOSIS — J3081 Allergic rhinitis due to animal (cat) (dog) hair and dander: Secondary | ICD-10-CM | POA: Diagnosis not present

## 2023-09-29 DIAGNOSIS — J3089 Other allergic rhinitis: Secondary | ICD-10-CM | POA: Diagnosis not present

## 2023-10-07 DIAGNOSIS — D485 Neoplasm of uncertain behavior of skin: Secondary | ICD-10-CM | POA: Diagnosis not present

## 2023-10-07 DIAGNOSIS — J3081 Allergic rhinitis due to animal (cat) (dog) hair and dander: Secondary | ICD-10-CM | POA: Diagnosis not present

## 2023-10-07 DIAGNOSIS — J301 Allergic rhinitis due to pollen: Secondary | ICD-10-CM | POA: Diagnosis not present

## 2023-10-07 DIAGNOSIS — L859 Epidermal thickening, unspecified: Secondary | ICD-10-CM | POA: Diagnosis not present

## 2023-10-07 DIAGNOSIS — J3089 Other allergic rhinitis: Secondary | ICD-10-CM | POA: Diagnosis not present

## 2023-10-13 DIAGNOSIS — J301 Allergic rhinitis due to pollen: Secondary | ICD-10-CM | POA: Diagnosis not present

## 2023-10-13 DIAGNOSIS — J3089 Other allergic rhinitis: Secondary | ICD-10-CM | POA: Diagnosis not present

## 2023-10-13 DIAGNOSIS — J3081 Allergic rhinitis due to animal (cat) (dog) hair and dander: Secondary | ICD-10-CM | POA: Diagnosis not present

## 2023-10-20 DIAGNOSIS — J3081 Allergic rhinitis due to animal (cat) (dog) hair and dander: Secondary | ICD-10-CM | POA: Diagnosis not present

## 2023-10-20 DIAGNOSIS — J3089 Other allergic rhinitis: Secondary | ICD-10-CM | POA: Diagnosis not present

## 2023-10-20 DIAGNOSIS — J301 Allergic rhinitis due to pollen: Secondary | ICD-10-CM | POA: Diagnosis not present

## 2023-10-27 DIAGNOSIS — J3089 Other allergic rhinitis: Secondary | ICD-10-CM | POA: Diagnosis not present

## 2023-10-27 DIAGNOSIS — J3081 Allergic rhinitis due to animal (cat) (dog) hair and dander: Secondary | ICD-10-CM | POA: Diagnosis not present

## 2023-11-09 DIAGNOSIS — J3081 Allergic rhinitis due to animal (cat) (dog) hair and dander: Secondary | ICD-10-CM | POA: Diagnosis not present

## 2023-11-09 DIAGNOSIS — J3089 Other allergic rhinitis: Secondary | ICD-10-CM | POA: Diagnosis not present

## 2023-11-09 DIAGNOSIS — J301 Allergic rhinitis due to pollen: Secondary | ICD-10-CM | POA: Diagnosis not present

## 2023-11-16 DIAGNOSIS — J3089 Other allergic rhinitis: Secondary | ICD-10-CM | POA: Diagnosis not present

## 2023-11-16 DIAGNOSIS — J3081 Allergic rhinitis due to animal (cat) (dog) hair and dander: Secondary | ICD-10-CM | POA: Diagnosis not present

## 2023-11-22 DIAGNOSIS — J301 Allergic rhinitis due to pollen: Secondary | ICD-10-CM | POA: Diagnosis not present

## 2023-11-22 DIAGNOSIS — J3089 Other allergic rhinitis: Secondary | ICD-10-CM | POA: Diagnosis not present

## 2023-11-22 DIAGNOSIS — J3081 Allergic rhinitis due to animal (cat) (dog) hair and dander: Secondary | ICD-10-CM | POA: Diagnosis not present

## 2023-11-26 DIAGNOSIS — G4733 Obstructive sleep apnea (adult) (pediatric): Secondary | ICD-10-CM | POA: Diagnosis not present

## 2023-11-30 DIAGNOSIS — J3081 Allergic rhinitis due to animal (cat) (dog) hair and dander: Secondary | ICD-10-CM | POA: Diagnosis not present

## 2023-11-30 DIAGNOSIS — J301 Allergic rhinitis due to pollen: Secondary | ICD-10-CM | POA: Diagnosis not present

## 2023-11-30 DIAGNOSIS — J3089 Other allergic rhinitis: Secondary | ICD-10-CM | POA: Diagnosis not present

## 2023-12-03 DIAGNOSIS — G4733 Obstructive sleep apnea (adult) (pediatric): Secondary | ICD-10-CM | POA: Diagnosis not present

## 2023-12-08 DIAGNOSIS — J301 Allergic rhinitis due to pollen: Secondary | ICD-10-CM | POA: Diagnosis not present

## 2023-12-08 DIAGNOSIS — J3081 Allergic rhinitis due to animal (cat) (dog) hair and dander: Secondary | ICD-10-CM | POA: Diagnosis not present

## 2023-12-08 DIAGNOSIS — J3089 Other allergic rhinitis: Secondary | ICD-10-CM | POA: Diagnosis not present

## 2023-12-16 DIAGNOSIS — J3089 Other allergic rhinitis: Secondary | ICD-10-CM | POA: Diagnosis not present

## 2023-12-16 DIAGNOSIS — J3081 Allergic rhinitis due to animal (cat) (dog) hair and dander: Secondary | ICD-10-CM | POA: Diagnosis not present

## 2023-12-16 DIAGNOSIS — J301 Allergic rhinitis due to pollen: Secondary | ICD-10-CM | POA: Diagnosis not present

## 2023-12-21 DIAGNOSIS — H16202 Unspecified keratoconjunctivitis, left eye: Secondary | ICD-10-CM | POA: Diagnosis not present

## 2023-12-23 DIAGNOSIS — J301 Allergic rhinitis due to pollen: Secondary | ICD-10-CM | POA: Diagnosis not present

## 2023-12-23 DIAGNOSIS — J3089 Other allergic rhinitis: Secondary | ICD-10-CM | POA: Diagnosis not present

## 2023-12-23 DIAGNOSIS — J3081 Allergic rhinitis due to animal (cat) (dog) hair and dander: Secondary | ICD-10-CM | POA: Diagnosis not present

## 2023-12-30 DIAGNOSIS — J3081 Allergic rhinitis due to animal (cat) (dog) hair and dander: Secondary | ICD-10-CM | POA: Diagnosis not present

## 2023-12-30 DIAGNOSIS — J301 Allergic rhinitis due to pollen: Secondary | ICD-10-CM | POA: Diagnosis not present

## 2023-12-30 DIAGNOSIS — J3089 Other allergic rhinitis: Secondary | ICD-10-CM | POA: Diagnosis not present

## 2024-01-06 DIAGNOSIS — J301 Allergic rhinitis due to pollen: Secondary | ICD-10-CM | POA: Diagnosis not present

## 2024-01-06 DIAGNOSIS — J3081 Allergic rhinitis due to animal (cat) (dog) hair and dander: Secondary | ICD-10-CM | POA: Diagnosis not present

## 2024-01-06 DIAGNOSIS — J3089 Other allergic rhinitis: Secondary | ICD-10-CM | POA: Diagnosis not present

## 2024-01-26 DIAGNOSIS — G4733 Obstructive sleep apnea (adult) (pediatric): Secondary | ICD-10-CM | POA: Diagnosis not present

## 2024-01-26 DIAGNOSIS — J3081 Allergic rhinitis due to animal (cat) (dog) hair and dander: Secondary | ICD-10-CM | POA: Diagnosis not present

## 2024-01-26 DIAGNOSIS — J3089 Other allergic rhinitis: Secondary | ICD-10-CM | POA: Diagnosis not present

## 2024-01-26 DIAGNOSIS — J301 Allergic rhinitis due to pollen: Secondary | ICD-10-CM | POA: Diagnosis not present

## 2024-02-01 DIAGNOSIS — J301 Allergic rhinitis due to pollen: Secondary | ICD-10-CM | POA: Diagnosis not present

## 2024-02-01 DIAGNOSIS — J3081 Allergic rhinitis due to animal (cat) (dog) hair and dander: Secondary | ICD-10-CM | POA: Diagnosis not present

## 2024-02-01 DIAGNOSIS — J3089 Other allergic rhinitis: Secondary | ICD-10-CM | POA: Diagnosis not present

## 2024-02-07 ENCOUNTER — Other Ambulatory Visit: Payer: Self-pay | Admitting: Orthopedic Surgery

## 2024-02-07 ENCOUNTER — Other Ambulatory Visit: Payer: Self-pay | Admitting: Family Medicine

## 2024-02-07 DIAGNOSIS — M79622 Pain in left upper arm: Secondary | ICD-10-CM

## 2024-02-07 DIAGNOSIS — E7800 Pure hypercholesterolemia, unspecified: Secondary | ICD-10-CM | POA: Diagnosis not present

## 2024-02-07 DIAGNOSIS — F418 Other specified anxiety disorders: Secondary | ICD-10-CM | POA: Diagnosis not present

## 2024-02-07 DIAGNOSIS — Z853 Personal history of malignant neoplasm of breast: Secondary | ICD-10-CM | POA: Diagnosis not present

## 2024-02-07 DIAGNOSIS — Z0000 Encounter for general adult medical examination without abnormal findings: Secondary | ICD-10-CM | POA: Diagnosis not present

## 2024-02-10 DIAGNOSIS — J3081 Allergic rhinitis due to animal (cat) (dog) hair and dander: Secondary | ICD-10-CM | POA: Diagnosis not present

## 2024-02-10 DIAGNOSIS — J301 Allergic rhinitis due to pollen: Secondary | ICD-10-CM | POA: Diagnosis not present

## 2024-02-10 DIAGNOSIS — J3089 Other allergic rhinitis: Secondary | ICD-10-CM | POA: Diagnosis not present

## 2024-02-13 DIAGNOSIS — C50919 Malignant neoplasm of unspecified site of unspecified female breast: Secondary | ICD-10-CM | POA: Diagnosis not present

## 2024-02-13 DIAGNOSIS — M7989 Other specified soft tissue disorders: Secondary | ICD-10-CM | POA: Diagnosis not present

## 2024-02-13 DIAGNOSIS — K648 Other hemorrhoids: Secondary | ICD-10-CM | POA: Diagnosis not present

## 2024-02-15 ENCOUNTER — Ambulatory Visit
Admission: RE | Admit: 2024-02-15 | Discharge: 2024-02-15 | Disposition: A | Discharge status: Home / Self Care | Source: Ambulatory Visit | Attending: Family Medicine | Admitting: Family Medicine

## 2024-02-15 DIAGNOSIS — M79622 Pain in left upper arm: Secondary | ICD-10-CM

## 2024-02-15 DIAGNOSIS — J3089 Other allergic rhinitis: Secondary | ICD-10-CM | POA: Diagnosis not present

## 2024-02-15 DIAGNOSIS — J301 Allergic rhinitis due to pollen: Secondary | ICD-10-CM | POA: Diagnosis not present

## 2024-02-15 DIAGNOSIS — N6489 Other specified disorders of breast: Secondary | ICD-10-CM | POA: Diagnosis not present

## 2024-02-16 DIAGNOSIS — Z23 Encounter for immunization: Secondary | ICD-10-CM | POA: Diagnosis not present

## 2024-02-22 DIAGNOSIS — J3081 Allergic rhinitis due to animal (cat) (dog) hair and dander: Secondary | ICD-10-CM | POA: Diagnosis not present

## 2024-02-22 DIAGNOSIS — J3089 Other allergic rhinitis: Secondary | ICD-10-CM | POA: Diagnosis not present

## 2024-02-22 DIAGNOSIS — J301 Allergic rhinitis due to pollen: Secondary | ICD-10-CM | POA: Diagnosis not present

## 2024-02-24 DIAGNOSIS — G4733 Obstructive sleep apnea (adult) (pediatric): Secondary | ICD-10-CM | POA: Diagnosis not present

## 2024-02-29 DIAGNOSIS — J3081 Allergic rhinitis due to animal (cat) (dog) hair and dander: Secondary | ICD-10-CM | POA: Diagnosis not present

## 2024-02-29 DIAGNOSIS — J3089 Other allergic rhinitis: Secondary | ICD-10-CM | POA: Diagnosis not present

## 2024-03-06 DIAGNOSIS — L0292 Furuncle, unspecified: Secondary | ICD-10-CM | POA: Diagnosis not present

## 2024-03-06 DIAGNOSIS — G4733 Obstructive sleep apnea (adult) (pediatric): Secondary | ICD-10-CM | POA: Diagnosis not present

## 2024-03-08 ENCOUNTER — Ambulatory Visit: Admitting: Family Medicine

## 2024-03-08 ENCOUNTER — Encounter: Payer: Self-pay | Admitting: Family Medicine

## 2024-03-08 VITALS — BP 116/78 | Ht 64.0 in | Wt 143.0 lb

## 2024-03-08 DIAGNOSIS — J3081 Allergic rhinitis due to animal (cat) (dog) hair and dander: Secondary | ICD-10-CM | POA: Diagnosis not present

## 2024-03-08 DIAGNOSIS — Z9889 Other specified postprocedural states: Secondary | ICD-10-CM | POA: Diagnosis not present

## 2024-03-08 DIAGNOSIS — J301 Allergic rhinitis due to pollen: Secondary | ICD-10-CM | POA: Diagnosis not present

## 2024-03-08 DIAGNOSIS — M7989 Other specified soft tissue disorders: Secondary | ICD-10-CM

## 2024-03-08 DIAGNOSIS — Z853 Personal history of malignant neoplasm of breast: Secondary | ICD-10-CM | POA: Diagnosis not present

## 2024-03-08 DIAGNOSIS — J3089 Other allergic rhinitis: Secondary | ICD-10-CM | POA: Diagnosis not present

## 2024-03-08 NOTE — Progress Notes (Unsigned)
 PCP: Rolinda Millman, MD  Patient is a 58 y.o. female here for swelling in left axilla.  Left axillary swelling, fullness, discomfort Patient has a PMH of breast CA with bilateral mastectomy in 2012, complicated by staph infection. Subsequently, she had bilateral breast reconstruction with lat flap in 2013. She did have 3 lymph node biopsies on the left side.  At baseline, patient has long had a mild degree of swelling in her axillae. However, left-sided swelling got worse around September and became painful with certain PT movements. Patient suspects lymphedema under her left armpit given her extensive surgical history. Breast US  w/ axilla negative for mass on 02/15/2024. A few weeks ago, patient saw a massage therapist specializing in lymphatic massage with some benefit, but only temporarily. Patient has continued home PT exercises (given last visit 1 year ago).  Certain exercises with light weights cause her pain under her left armpit at the area of swelling.  Patient was last here on 03/07/2023 for right shoulder injury (MR right shoulder showed mild supraspinatus tendinosis), completed course of PT. Patient plays tennis avidly.  Plays tennis 3-4 times weekly, is right-handed.  History reviewed. No pertinent past medical history.  Medications Ordered Prior to Encounter[1]  History reviewed. No pertinent surgical history.  Allergies[2]  BP 116/78   Ht 5' 4 (1.626 m)   Wt 143 lb (64.9 kg)   BMI 24.55 kg/m      12/23/2020    2:31 PM 01/13/2021    1:35 PM  [facility]  Frequency of aerobic exercise (# of days/week) 6 6  Average time in minutes 60 60  Frequency of strengthening activities (# of days/week) 0 0        No data to display              Objective:  Physical Exam:  Gen: NAD, comfortable in exam room  Location: Left shoulder and axilla - Inspection: Visible fullness/edema compared to right, no overlying skin changes, no visible  surgical scars immediately adjacent - Palpation: Palpable soft, nonrubbery, nontender fullness anterior to left axilla - ROM: Full passive ROM of left rotator cuff, no limitation - Strength: 5/5 in all directions - Special Tests: Negative Hawkins, negative Neer's, negative empty can. - Neurovascular: 2+ radial pulses bilaterally, no distal paresthesias   Assessment and Plan:   Left axillary lymphedema 2/2 extensive surgical history Reassuringly, patient has already completed diagnostic ultrasound without red flag findings for recurrent malignancy.  Given the patient's extensive surgical history with likely significant lymphatic disruption, lymphedema is likely.  Findings are limited to left axilla, no involvement of distal arm. - Continue with conservative treatment including massage, stretching - Continue tennis and PT exercises as tolerated - Return if swelling worsening or spreading to arm  Claudetta Silence, MD PGY-2, [facility] 03/08/2024, 4:19 PM    [1]  Current Outpatient Medications on File Prior to Visit  Medication Sig Dispense Refill   albuterol  (VENTOLIN  HFA) 108 (90 Base) MCG/ACT inhaler Inhale 2 puffs into the lungs every 4 (four) hours as needed.     Azelastine-Fluticasone 137-50 MCG/ACT SUSP Place 1 spray into both nostrils 2 (two) times daily.     buPROPion (WELLBUTRIN XL) 150 MG 24 hr tablet Take 150 mg by mouth every morning.     buPROPion (WELLBUTRIN XL) 300 MG 24 hr tablet Take 300 mg by mouth daily.     calcium-vitamin D (OSCAL WITH D) 500-200 MG-UNIT TABS tablet Take by mouth.  cloNIDine (CATAPRES) 0.1 MG tablet Take 0.05 mg by mouth at bedtime.     EPINEPHrine 0.3 mg/0.3 mL IJ SOAJ injection Inject into the muscle as directed.     estradiol (ESTRACE) 0.1 MG/GM vaginal cream Place vaginally.     levocetirizine (XYZAL) 5 MG tablet Take 5 mg by mouth daily.     Multiple Vitamins-Minerals (DAILY MULTIVITAMIN) CAPS Take by mouth.     No current  facility-administered medications on file prior to visit.  [2]  Allergies Allergen Reactions   Dust Mite Extract Other (See Comments)    Runny nose, itching Runny nose, itching    Kiwi Extract Itching and Other (See Comments)    Mouth sores and itching Mouth sores and itching    Oxycodone-Acetaminophen Nausea And Vomiting   Chlorhexidine Rash and Hives   Tape Rash

## 2024-03-15 DIAGNOSIS — J3089 Other allergic rhinitis: Secondary | ICD-10-CM | POA: Diagnosis not present

## 2024-03-15 DIAGNOSIS — M25612 Stiffness of left shoulder, not elsewhere classified: Secondary | ICD-10-CM | POA: Diagnosis not present

## 2024-03-15 DIAGNOSIS — J3081 Allergic rhinitis due to animal (cat) (dog) hair and dander: Secondary | ICD-10-CM | POA: Diagnosis not present

## 2024-03-15 DIAGNOSIS — M7541 Impingement syndrome of right shoulder: Secondary | ICD-10-CM | POA: Diagnosis not present

## 2024-03-15 DIAGNOSIS — J301 Allergic rhinitis due to pollen: Secondary | ICD-10-CM | POA: Diagnosis not present

## 2024-03-20 DIAGNOSIS — J301 Allergic rhinitis due to pollen: Secondary | ICD-10-CM | POA: Diagnosis not present

## 2024-03-20 DIAGNOSIS — J3089 Other allergic rhinitis: Secondary | ICD-10-CM | POA: Diagnosis not present

## 2024-03-20 DIAGNOSIS — J3081 Allergic rhinitis due to animal (cat) (dog) hair and dander: Secondary | ICD-10-CM | POA: Diagnosis not present

## 2024-03-27 DIAGNOSIS — J3081 Allergic rhinitis due to animal (cat) (dog) hair and dander: Secondary | ICD-10-CM | POA: Diagnosis not present

## 2024-03-27 DIAGNOSIS — J3089 Other allergic rhinitis: Secondary | ICD-10-CM | POA: Diagnosis not present

## 2024-03-27 DIAGNOSIS — J301 Allergic rhinitis due to pollen: Secondary | ICD-10-CM | POA: Diagnosis not present

## 2024-04-01 NOTE — Therapy (Signed)
 " OUTPATIENT PHYSICAL THERAPY  UPPER EXTREMITY ONCOLOGY EVALUATION  Patient Name: Cynthia Pratt MRN: [id number] DOB:29-Nov-1965, 59 y.o., female Today's Date: 04/02/2024  END OF SESSION:  PT End of Session - 04/02/24 1600     Visit Number 1    Number of Visits 10    Date for Recertification  05/07/24    PT Start Time 1500    PT Stop Time 1603    PT Time Calculation (min) 63 min    Activity Tolerance Patient tolerated treatment well    Behavior During Therapy [facility] for tasks assessed/performed          History reviewed. No pertinent past medical history. History reviewed. No pertinent surgical history. Patient Active Problem List   Diagnosis Date Noted   Rotator cuff tear arthropathy, right 02/08/2023   Avulsion fracture of left thumb with routine healing 02/08/2023   Rupture of UCL of right thumb 02/08/2023   Stress reaction of right foot 12/23/2020   Extensor carpi ulnaris tendinitis 12/23/2020    PCP: Same as referring  REFERRING PROVIDER: Vernell Fort, MD  REFERRING DIAG: Lymphedema  THERAPY DIAG:  History of breast cancer  Lymphedema  ONSET DATE: 11/2023  Rationale for Evaluation and Treatment: Rehabilitation  SUBJECTIVE:                                                                                                                                                                                           SUBJECTIVE STATEMENT:  Pt was doing  rehab  passing a wt behind her back from 1 hand to the other and noticed swelling in the Left axilla and lateral trunk and in the upper arm.  She had some lymphatic massage that helped but it was temporary. I have a compression bra that has a half sleeve attached. I have several things at home that I have bought in the last month or so and I forgot to bring them. I was having pain in the armpit, but it went away, unless I do a lot with my arm.  PERTINENT HISTORY:  Pt. Is s/p bilateral Mastectomies in 2012 with Left SLNB of 3  Nodes. She had bilateral breast reconstruction in 2013 with Lat flap and Implant. She has had a mild degree of swelling in the left axilla since then, however, swelling became worse around September 2025, and painful with certain PT movements.  She saw a massage therapist who specializes in Lymphatic massage with some benefit, but only temporary. She has also done some home lymphatic drainage with techniques she was instructed in. 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  PAIN:  Are you having pain? Yes NPRS scale: 1/10 Pain location: left axilla Pain orientation: Left  PAIN TYPE: tender or pain if moving in a different Pain description: intermittent  Aggravating factors: certain movements with wt, reaching behind back Relieving factors: avoiding movements like behind the back  PRECAUTIONS:  Left UE lymphedema(lateral trunk, upper arm on left)   RED FLAGS: None   WEIGHT BEARING RESTRICTIONS: No  FALLS:  Has patient fallen in last 6 months? No  LIVING ENVIRONMENT: Lives with: lives with their spouse Lives in: House/apartment   OCCUPATION: communications consulting work  LEISURE: Plays tennis 3-4 times per week, running  HAND DOMINANCE: right   PRIOR LEVEL OF FUNCTION: Independent  PATIENT GOALS: Left UE lymphedema risk.   OBJECTIVE: Note: Objective measures were completed at Evaluation unless otherwise noted.  COGNITION: Overall cognitive status: Within functional limits for tasks assessed   PALPATION: No present tenderness  OBSERVATIONS / OTHER ASSESSMENTS: mild swelling/increased tension left lateral trunk and right  SENSATION: Light touch: Deficits     POSTURE: forward head  UPPER EXTREMITY AROM/PROM:  A/PROM RIGHT   eval   Shoulder extension   Shoulder flexion   Shoulder abduction   Shoulder internal rotation   Shoulder external rotation     (Blank rows = not tested)  A/PROM LEFT   eval   Shoulder extension   Shoulder flexion WNL  Shoulder abduction WNL  Shoulder internal rotation   Shoulder external rotation     (Blank rows = not tested)  CERVICAL AROM: All within fxl limits:    UPPER EXTREMITY STRENGTH:   LYMPHEDEMA ASSESSMENTS:   SURGERY TYPE/DATE: s/p bilateral Mastectomies in 2012 with Left SLNB And bilateral lat flap reconstruction with implants in 2013  NUMBER OF LYMPH NODES REMOVED: 0+/3  CHEMOTHERAPY: NO  RADIATION:NO  HORMONE TREATMENT: Yes, but not presently, completed  INFECTIONS: Staph infection   LYMPHEDEMA ASSESSMENTS:   LANDMARK RIGHT  eval  At axilla  31.3  15 cm proximal to the proximal aspect of the olecranon process 31.0  10 cm proximal to the proximal aspect of the olecranon process 29.3  Olecranon process 24.3  15 cm proximal to the proximal aspect of the ulnar styloid process 22.9  10 cm proximal to the proximal aspect of the ulnar styloid process 19.6  Just distal to the ulnar styloid process 14.3  Across hand at thumb web space 18.6  At base of 2nd digit 5.7  (Blank rows = not tested)  LANDMARK LEFT  eval  At axilla  31.5  15 cm proximal to the proximal aspect of the olecranon process 31.7  10 cm proximal to the proximal aspect of the olecranon process 30.0  Olecranon process 23.8  15 cm proximal to the proximal aspect of the ulnar styloid process 22.0  10 cm proximal to  the proximal aspect of the ulnar styloid process 19.5  Just distal to the ulnar styloid process 14  Across hand at thumb web space 18.7  At base of 2nd digit 5.8  (Blank rows = not tested)  Chest circumference just inferior to the axillae:  Chest circumference at the largest point:      L-DEX LYMPHEDEMA SCREENING: The patient was assessed using the L-Dex machine today to produce a lymphedema index baseline score. The patient will be reassessed on a regular basis (typically every 3 months) to obtain new L-Dex scores. If the score is > 6.5 points  away from his/her baseline score  indicating onset of subclinical lymphedema, it will be recommended to wear a compression garment for 4 weeks, 12 hours per day and then be reassessed. If the score continues to be > 6.5 points from baseline at reassessment, we will initiate lymphedema treatment. Assessing in this manner has a 95% rate of preventing clinically significant lymphedema.  Lymphedema life impact scale:19                                                                                                                            TREATMENT DATE:  04/02/2024 Discussed lymphedema and options for treatment. Pt has already bought compression shirts, bras etc. Advised to bring all with her next time and we will see if we can make them work. May benefit from pad in axilla, or may require a compression sleeve. Initiated MLD to left arm and lateral trunk. In supine: Short neck, 5 diaphragmatic breaths, R and left axillary nodes and establishment of interaxillary pathway, L inguinal nodes and establishment of axilloinguinal pathway, then L UE working proximal to distal, moving fluid from upper inner arm outwards, and doing both sides of forearm moving fluid towards pathways spending extra time in any areas of fibrosis then retracing all steps . Showed poster demonstrating superficial nature of lymphatics as reason for gentle stretch and not heavy pressure.     PATIENT EDUCATION:  Education details: MLD, compression, garment options and bring with her what she has Person educated: Patient Education method: Medical Illustrator Education comprehension: verbalized understanding and needs further education  HOME EXERCISE PROGRAM:   ASSESSMENT:  CLINICAL IMPRESSION: Patient is a 59 y.o. female who was seen today for physical therapy evaluation and treatment for complaints of mild  left axillary pain and swelling in the axilla and lateral trunk.  Pt is s/p bilateral Mastectomies in 2012 with  Left SLNB and bilateral Lat flap Reconstruction with implants in 2013. In September she noticed increase in axillary and lateral trunk swelling, which benefited from lymphatic massage techniques of a Massage therapist. We discussed benefits of MLD and differences between what a massage therapist might do and a PT trained in MLD. We also discussed compression and pt has purchased several things and will bring them in with her so we can assess for potential benefit. She will benefit from skilled PT to instruct pt in Manual lymphatic drainage techniques and assess her compression. Pt will also watch ABC video so all questions may be answered with the goal of pt being independent in her self care.   OBJECTIVE IMPAIRMENTS: decreased knowledge of condition, increased edema, impaired sensation, postural dysfunction, and pain.   ACTIVITY LIMITATIONS: reaching behind back, certain motions create mild pain in left axilla. Not presently having  PARTICIPATION LIMITATIONS: no real restrictions, avoids exs passing wt behind back  PERSONAL FACTORS: 1-2 comorbidities: bilateral mastectomies s/p lat flaps with implant, Left SLNB are also affecting patient's functional outcome.   REHAB POTENTIAL: Good  CLINICAL DECISION MAKING: Stable/uncomplicated  EVALUATION COMPLEXITY: Low  GOALS: Goals reviewed with patient? Yes  SHORT TERM GOALS=LONG TERM GOALS: Target date: 05/05/2023  Pt will notice improvements in swelling with MLD techniques Baseline: Goal status: INITIAL  2.  Pt will be educated in and compliant with MLD techniques to decrease left axillary /lateral trunk and upper arm swelling Baseline:  Goal status: INITIAL  3.  Pt will be educated in options for compression garments, or present garments will be modified prn to make them proficient Baseline:  Goal status: INITIAL  4.  Pt will purchase compression sleeve prn for continued gym workouts Baseline:  Goal status: INITIAL  5.  Pt will have all  questions about lymphedema answered Baseline:  Goal status: INITIAL  PLAN:  PT FREQUENCY: 1-2x/week  PT DURATION: other: 5 weeks  PLANNED INTERVENTIONS: 97164- PT Re-evaluation, 97110-Therapeutic exercises, 97530- Therapeutic activity, 97112- Neuromuscular re-education, 97535- Self Care, 02859- Manual therapy, 97760- Orthotic Initial, [phone]- Orthotic/Prosthetic subsequent, and Patient/Family education  PLAN FOR NEXT SESSION: give ABC video info for her to watch, SOZO?, check compression garments that she brings to see what may be effective, consider compression sleeve, MLD and instruct pt.  Grayce JINNY Sheldon, PT 04/02/2024, 4:09 PM  "

## 2024-04-02 ENCOUNTER — Other Ambulatory Visit: Payer: Self-pay

## 2024-04-02 ENCOUNTER — Ambulatory Visit: Attending: Family Medicine

## 2024-04-02 DIAGNOSIS — I890 Lymphedema, not elsewhere classified: Secondary | ICD-10-CM | POA: Insufficient documentation

## 2024-04-02 DIAGNOSIS — Z853 Personal history of malignant neoplasm of breast: Secondary | ICD-10-CM | POA: Diagnosis not present

## 2024-04-04 ENCOUNTER — Ambulatory Visit: Admitting: Rehabilitation

## 2024-04-09 ENCOUNTER — Ambulatory Visit

## 2024-04-09 DIAGNOSIS — I890 Lymphedema, not elsewhere classified: Secondary | ICD-10-CM | POA: Diagnosis not present

## 2024-04-09 DIAGNOSIS — Z853 Personal history of malignant neoplasm of breast: Secondary | ICD-10-CM

## 2024-04-09 NOTE — Therapy (Signed)
 " OUTPATIENT PHYSICAL THERAPY  UPPER EXTREMITY ONCOLOGY TREATMENT  Patient Name: Cynthia Pratt MRN: [id number] DOB:30-Nov-1965, 59 y.o., female Today's Date: 04/09/2024  END OF SESSION:  PT End of Session - 04/09/24 1158     Visit Number 2    Number of Visits 5    Date for Recertification  05/07/24    Authorization Type [employer]    Authorization Time Period 04/02/2024-05/31/2024    Authorization - Visit Number 2    Authorization - Number of Visits 5    Progress Note Due on Visit 5    PT Start Time 1200    PT Stop Time 1258    PT Time Calculation (min) 58 min    Activity Tolerance Patient tolerated treatment well    Behavior During Therapy [facility] for tasks assessed/performed          History reviewed. No pertinent past medical history. History reviewed. No pertinent surgical history. Patient Active Problem List   Diagnosis Date Noted   Rotator cuff tear arthropathy, right 02/08/2023   Avulsion fracture of left thumb with routine healing 02/08/2023   Rupture of UCL of right thumb 02/08/2023   Stress reaction of right foot 12/23/2020   Extensor carpi ulnaris tendinitis 12/23/2020    PCP: Same as referring  REFERRING PROVIDER: Vernell Fort, MD  REFERRING DIAG: Lymphedema  THERAPY DIAG:  History of breast cancer  Lymphedema  ONSET DATE: 11/2023  Rationale for Evaluation and Treatment: Rehabilitation  SUBJECTIVE:                                                                                                                                                                                           SUBJECTIVE STATEMENT:  I brought my different garments with me so we could check. I felt better after the lymph drainage here last time.  EVAL Pt was doing  rehab  passing a wt behind her back from 1 hand to the other and noticed swelling in the Left axilla and lateral trunk and in the upper arm.  She had some lymphatic massage that helped but it was temporary. I have a compression  bra that has a half sleeve attached. I have several things at home that I have bought in the last month or so and I forgot to bring them. I was having pain in the armpit, but it went away, unless I do a lot with my arm.  PERTINENT HISTORY:  Pt. Is s/p bilateral Mastectomies in 2012 with Left SLNB of 3 Nodes. She had bilateral breast reconstruction in 2013 with Lat flap and Implant. She has had a mild degree of swelling in  the left axilla since then, however, swelling became worse around September 2025, and painful with certain PT movements.  She saw a massage therapist who specializes in Lymphatic massage with some benefit, but only temporary. She has also done some home lymphatic drainage with techniques she was instructed in. 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  PAIN:  Are you having pain? Yes, intermittently NPRS scale: 0/10 Pain location: left axilla Pain orientation: Left  PAIN TYPE: tender or pain if moving in a different Pain description: intermittent  Aggravating factors: certain movements with wt, reaching behind back Relieving factors: avoiding movements like behind the back  PRECAUTIONS:  Left UE lymphedema(lateral trunk, upper arm on left)   RED FLAGS: None   WEIGHT BEARING RESTRICTIONS: No  FALLS:  Has patient fallen in last 6 months? No  LIVING ENVIRONMENT: Lives with: lives with their spouse Lives in: House/apartment   OCCUPATION: communications consulting work  LEISURE: Plays tennis 3-4 times per week, running  HAND DOMINANCE: right   PRIOR LEVEL OF FUNCTION: Independent  PATIENT GOALS: Left UE lymphedema risk.   OBJECTIVE: Note: Objective measures were completed at Evaluation unless otherwise noted.  COGNITION: Overall cognitive status: Within functional limits for tasks assessed   PALPATION: No present tenderness  OBSERVATIONS / OTHER ASSESSMENTS: mild swelling/increased tension left  lateral trunk and right  SENSATION: Light touch: Deficits     POSTURE: forward head  UPPER EXTREMITY AROM/PROM:  A/PROM RIGHT   eval   Shoulder extension   Shoulder flexion   Shoulder abduction   Shoulder internal rotation   Shoulder external rotation     (Blank rows = not tested)  A/PROM LEFT   eval  Shoulder extension   Shoulder flexion WNL  Shoulder abduction WNL  Shoulder internal rotation   Shoulder external rotation     (Blank rows = not tested)  CERVICAL AROM: All within fxl limits:    UPPER EXTREMITY STRENGTH:   LYMPHEDEMA ASSESSMENTS:   SURGERY TYPE/DATE: s/p bilateral Mastectomies in 2012 with Left SLNB And bilateral lat flap reconstruction with implants in 2013  NUMBER OF LYMPH NODES REMOVED: 0+/3  CHEMOTHERAPY: NO  RADIATION:NO  HORMONE TREATMENT: Yes, but not presently, completed  INFECTIONS: Staph infection   LYMPHEDEMA ASSESSMENTS:   LANDMARK RIGHT  eval  At axilla  31.3  15 cm proximal to the proximal aspect of the olecranon process 31.0  10 cm proximal to the proximal aspect of the olecranon process 29.3  Olecranon process 24.3  15 cm proximal to the proximal aspect of the ulnar styloid process 22.9  10 cm proximal to the proximal aspect of the ulnar styloid process 19.6  Just distal to the ulnar styloid process 14.3  Across hand at thumb web space 18.6  At base of 2nd digit 5.7  (Blank rows = not tested)  LANDMARK LEFT  eval  At axilla  31.5  15 cm proximal to the proximal aspect of the olecranon process 31.7  10 cm proximal to the proximal aspect of the olecranon process 30.0  Olecranon process 23.8  15 cm proximal to the proximal aspect of the ulnar styloid process 22.0  10 cm proximal to  the proximal aspect of the ulnar styloid process 19.5  Just distal to the ulnar styloid process 14  Across hand at thumb web space 18.7  At base of 2nd digit 5.8  (Blank rows = not tested)  Chest circumference just inferior to the  axillae:  Chest circumference at the largest point:      L-DEX LYMPHEDEMA SCREENING: The patient was assessed using the L-Dex machine today to produce a lymphedema index baseline score. The patient will be reassessed on a regular basis (typically every 3 months) to obtain new L-Dex scores. If the score is > 6.5 points away from his/her baseline score indicating onset of subclinical lymphedema, it will be recommended to wear a compression garment for 4 weeks, 12 hours per day and then be reassessed. If the score continues to be > 6.5 points from baseline at reassessment, we will initiate lymphedema treatment. Assessing in this manner has a 95% rate of preventing clinically significant lymphedema.  Lymphedema life impact scale:19                                                                                                                            TREATMENT DATE:  04/10/2023 Pt brought in multiple garments to show me; compression strap,sports bra and compression top with short arms which is preferable to hold a pad if needed in the axilla In sitting; therapist demonstrated and had pt return demonstrate all techniques for MLD as listed below. Short neck with opposite hand on , 5 diaphragmatic breaths, R and left axillary nodes and establishment of interaxillary pathway, L inguinal nodes and establishment of axilloinguinal pathway, then L UE working proximal to distal, moving fluid from upper inner arm outwards, and doing both sides of forearm moving fluid towards pathways spending extra time in any areas of fibrosis then retracing all steps . Showed pt again poster demonstrating superficial nature of lymphatics as reason for gentle stretch and not heavy pressure.   04/02/2024 Discussed lymphedema and options for treatment. Pt has already bought compression shirts, bras etc. Advised to bring all with her next time and we will see if we can make them work. May benefit from pad in axilla, or may require  a compression sleeve. Initiated MLD to left arm and lateral trunk. In supine: Short neck, 5 diaphragmatic breaths, R and left axillary nodes and establishment of interaxillary pathway, L inguinal nodes and establishment of axilloinguinal pathway, then L UE working proximal to distal, moving fluid from upper inner arm outwards, and doing both sides of forearm moving fluid towards pathways spending extra time in any areas of fibrosis then retracing all steps . Showed poster demonstrating superficial nature of lymphatics as reason for gentle stretch and not heavy pressure.     PATIENT EDUCATION:  Education details: MLD, compression, garment options and bring with her what she has Person educated: Patient Education method: Medical Illustrator Education comprehension: verbalized understanding and needs further education  HOME EXERCISE PROGRAM:   ASSESSMENT:  CLINICAL IMPRESSION:  Pt did very well return demonstrating techniques with appropriate pressure and stretch, with intermittent  VC's for direction. Updated pt with written instructions.  EVAL Patient is a 59 y.o. female who was seen today for physical therapy evaluation and treatment  for complaints of mild  left axillary pain and swelling in the axilla and lateral trunk.  Pt is s/p bilateral Mastectomies in 2012 with Left SLNB and bilateral Lat flap Reconstruction with implants in 2013. In September she noticed increase in axillary and lateral trunk swelling, which benefited from lymphatic massage techniques of a Massage therapist. We discussed benefits of MLD and differences between what a massage therapist might do and a PT trained in MLD. We also discussed compression and pt has purchased several things and will bring them in with her so we can assess for potential benefit. She will benefit from skilled PT to instruct pt in Manual lymphatic drainage techniques and assess her compression. Pt will also watch ABC video so all questions  may be answered with the goal of pt being independent in her self care.   OBJECTIVE IMPAIRMENTS: decreased knowledge of condition, increased edema, impaired sensation, postural dysfunction, and pain.   ACTIVITY LIMITATIONS: reaching behind back, certain motions create mild pain in left axilla. Not presently having  PARTICIPATION LIMITATIONS: no real restrictions, avoids exs passing wt behind back  PERSONAL FACTORS: 1-2 comorbidities: bilateral mastectomies s/p lat flaps with implant, Left SLNB are also affecting patient's functional outcome.   REHAB POTENTIAL: Good  CLINICAL DECISION MAKING: Stable/uncomplicated  EVALUATION COMPLEXITY: Low  GOALS: Goals reviewed with patient? Yes  SHORT TERM GOALS=LONG TERM GOALS: Target date: 05/05/2023  Pt will notice improvements in swelling with MLD techniques Baseline: Goal status: INITIAL  2.  Pt will be educated in and compliant with MLD techniques to decrease left axillary /lateral trunk and upper arm swelling Baseline:  Goal status: INITIAL  3.  Pt will be educated in options for compression garments, or present garments will be modified prn to make them proficient Baseline:  Goal status: INITIAL  4.  Pt will purchase compression sleeve prn for continued gym workouts Baseline:  Goal status: INITIAL  5.  Pt will have all questions about lymphedema answered Baseline:  Goal status: INITIAL  PLAN:  PT FREQUENCY: 1-2x/week  PT DURATION: other: 5 weeks  PLANNED INTERVENTIONS: 97164- PT Re-evaluation, 97110-Therapeutic exercises, 97530- Therapeutic activity, [id number]- Neuromuscular re-education, 97535- Self Care, 02859- Manual therapy, [id number]- Orthotic Initial, [id number]- Orthotic/Prosthetic subsequent, and Patient/Family education  PLAN FOR NEXT SESSION: give ABC video info for her to watch, SOZO?,, MLD and review with pt. Again, compression sleeve prn  Grayce JINNY Sheldon, PT 04/09/2024, 1:23 PM  "

## 2024-04-11 ENCOUNTER — Ambulatory Visit: Admitting: Rehabilitation

## 2024-04-16 ENCOUNTER — Ambulatory Visit

## 2024-04-18 ENCOUNTER — Ambulatory Visit

## 2024-04-18 DIAGNOSIS — I890 Lymphedema, not elsewhere classified: Secondary | ICD-10-CM | POA: Diagnosis not present

## 2024-04-18 DIAGNOSIS — Z853 Personal history of malignant neoplasm of breast: Secondary | ICD-10-CM

## 2024-04-18 NOTE — Therapy (Signed)
 " OUTPATIENT PHYSICAL THERAPY  UPPER EXTREMITY ONCOLOGY TREATMENT  Patient Name: Cynthia Pratt MRN: [id number] DOB:October 13, 1965, 59 y.o., female Today's Date: 04/18/2024  END OF SESSION:  PT End of Session - 04/18/24 0802     Visit Number 3    Number of Visits 5    Authorization Type [employer]    Authorization Time Period 04/02/2024-05/31/2024    Authorization - Visit Number 3    Authorization - Number of Visits 5    PT Start Time 0802    PT Stop Time 0900    PT Time Calculation (min) 58 min    Activity Tolerance Patient tolerated treatment well    Behavior During Therapy [facility] for tasks assessed/performed          History reviewed. No pertinent past medical history. History reviewed. No pertinent surgical history. Patient Active Problem List   Diagnosis Date Noted   Rotator cuff tear arthropathy, right 02/08/2023   Avulsion fracture of left thumb with routine healing 02/08/2023   Rupture of UCL of right thumb 02/08/2023   Stress reaction of right foot 12/23/2020   Extensor carpi ulnaris tendinitis 12/23/2020    PCP: Same as referring  REFERRING PROVIDER: Vernell Fort, MD  REFERRING DIAG: Lymphedema  THERAPY DIAG:  History of breast cancer  Lymphedema  ONSET DATE: 11/2023  Rationale for Evaluation and Treatment: Rehabilitation  SUBJECTIVE:                                                                                                                                                                                           SUBJECTIVE STATEMENT:  I put some info into chat GPT and it gave me some info on MLD I thought you might like. You may have to modify it. I tried the MLD some, but not a lot. Pain is doing fine. I feel like my swelling is status quo. I go 2x's per week to [employer]  for conditioning, and I have started back to tennis.  EVAL Pt was doing  rehab  passing a wt behind her back from 1 hand to the other and noticed swelling in the Left axilla and lateral trunk  and in the upper arm.  She had some lymphatic massage that helped but it was temporary. I have a compression bra that has a half sleeve attached. I have several things at home that I have bought in the last month or so and I forgot to bring them. I was having pain in the armpit, but it went away, unless I do a lot with my arm.  PERTINENT HISTORY:  Pt. Is s/p bilateral Mastectomies in 2012 with  Left SLNB of 3 Nodes. She had bilateral breast reconstruction in 2013 with Lat flap and Implant. She has had a mild degree of swelling in the left axilla since then, however, swelling became worse around September 2025, and painful with certain PT movements.  She saw a massage therapist who specializes in Lymphatic massage with some benefit, but only temporary. She has also done some home lymphatic drainage with techniques she was instructed in. 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  PAIN:  Are you having pain? Yes, intermittently NPRS scale: 0/10 Pain location: left axilla Pain orientation: Left  PAIN TYPE: tender or pain if moving in a different Pain description: intermittent  Aggravating factors: certain movements with wt, reaching behind back Relieving factors: avoiding movements like behind the back  PRECAUTIONS:  Left UE lymphedema(lateral trunk, upper arm on left)   RED FLAGS: None   WEIGHT BEARING RESTRICTIONS: No  FALLS:  Has patient fallen in last 6 months? No  LIVING ENVIRONMENT: Lives with: lives with their spouse Lives in: House/apartment   OCCUPATION: communications consulting work  LEISURE: Plays tennis 3-4 times per week, running  HAND DOMINANCE: right   PRIOR LEVEL OF FUNCTION: Independent  PATIENT GOALS: Left UE lymphedema risk.   OBJECTIVE: Note: Objective measures were completed at Evaluation unless otherwise noted.  COGNITION: Overall cognitive status: Within functional limits for tasks  assessed   PALPATION: No present tenderness  OBSERVATIONS / OTHER ASSESSMENTS: mild swelling/increased tension left lateral trunk and right  SENSATION: Light touch: Deficits     POSTURE: forward head  UPPER EXTREMITY AROM/PROM:  A/PROM RIGHT   eval   Shoulder extension   Shoulder flexion   Shoulder abduction   Shoulder internal rotation   Shoulder external rotation     (Blank rows = not tested)  A/PROM LEFT   eval  Shoulder extension   Shoulder flexion WNL  Shoulder abduction WNL  Shoulder internal rotation   Shoulder external rotation     (Blank rows = not tested)  CERVICAL AROM: All within fxl limits:    UPPER EXTREMITY STRENGTH:   LYMPHEDEMA ASSESSMENTS:   SURGERY TYPE/DATE: s/p bilateral Mastectomies in 2012 with Left SLNB And bilateral lat flap reconstruction with implants in 2013  NUMBER OF LYMPH NODES REMOVED: 0+/3  CHEMOTHERAPY: NO  RADIATION:NO  HORMONE TREATMENT: Yes, but not presently, completed  INFECTIONS: Staph infection   LYMPHEDEMA ASSESSMENTS:   LANDMARK RIGHT  eval  At axilla  31.3  15 cm proximal to the proximal aspect of the olecranon process 31.0  10 cm proximal to the proximal aspect of the olecranon process 29.3  Olecranon process 24.3  15 cm proximal to the proximal aspect of the ulnar styloid process 22.9  10 cm proximal to the proximal aspect of the ulnar styloid process 19.6  Just distal to the ulnar styloid process 14.3  Across hand at thumb web space 18.6  At base of 2nd digit 5.7  (Blank rows = not tested)  LANDMARK LEFT  eval  At axilla  31.5  15 cm proximal to the proximal aspect of the olecranon process 31.7  10 cm proximal to the proximal aspect of the olecranon process 30.0  Olecranon process 23.8  15 cm proximal to the proximal aspect of the ulnar styloid process 22.0  10 cm proximal to  the proximal aspect of the ulnar styloid process 19.5  Just distal to the ulnar styloid process 14  Across  hand at  thumb web space 18.7  At base of 2nd digit 5.8  (Blank rows = not tested)  Chest circumference just inferior to the axillae:  Chest circumference at the largest point:      L-DEX LYMPHEDEMA SCREENING: The patient was assessed using the L-Dex machine today to produce a lymphedema index baseline score. The patient will be reassessed on a regular basis (typically every 3 months) to obtain new L-Dex scores. If the score is > 6.5 points away from his/her baseline score indicating onset of subclinical lymphedema, it will be recommended to wear a compression garment for 4 weeks, 12 hours per day and then be reassessed. If the score continues to be > 6.5 points from baseline at reassessment, we will initiate lymphedema treatment. Assessing in this manner has a 95% rate of preventing clinically significant lymphedema.  Lymphedema life impact scale:19                                                                                                                            TREATMENT DATE:   04/18/2024 Discussed progress In sitting; therapist talked pt through steps and had pt return demonstrate all techniques for MLD as listed below. Short neck with opposite hand on neck , 5 diaphragmatic breaths, R and left axillary nodes and establishment of interaxillary pathway, L inguinal nodes and establishment of axilloinguinal pathway, then L UE working proximal to distal, moving fluid from upper inner arm outwards, and doing both sides of forearm moving fluid towards pathways spending extra time in any areas of fibrosis then retracing all steps . Showed pt again poster demonstrating superficial nature of lymphatics as reason for gentle stretch and not heavy pressure. Discussed compression sleeve and pt messaged her MD to send a script to her for her compression sleeve, with dx code. Pt was also given a written script to drop off if she needs to.  04/10/2023 Pt brought in multiple garments to show me; compression  strap,sports bra and compression top with short arms which is preferable to hold a pad if needed in the axilla In sitting; therapist demonstrated and had pt return demonstrate all techniques for MLD as listed below. Short neck with opposite hand on , 5 diaphragmatic breaths, R and left axillary nodes and establishment of interaxillary pathway, L inguinal nodes and establishment of axilloinguinal pathway, then L UE working proximal to distal, moving fluid from upper inner arm outwards, and doing both sides of forearm moving fluid towards pathways spending extra time in any areas of fibrosis then retracing all steps . Showed pt again poster demonstrating superficial nature of lymphatics as reason for gentle stretch and not heavy pressure.   04/02/2024 Discussed lymphedema and options for treatment. Pt has already bought compression shirts, bras etc. Advised to bring all with her next time and we will see if we can make them work. May benefit from pad in axilla, or may require a compression sleeve. Initiated MLD to left  arm and lateral trunk. In supine: Short neck, 5 diaphragmatic breaths, R and left axillary nodes and establishment of interaxillary pathway, L inguinal nodes and establishment of axilloinguinal pathway, then L UE working proximal to distal, moving fluid from upper inner arm outwards, and doing both sides of forearm moving fluid towards pathways spending extra time in any areas of fibrosis then retracing all steps . Showed poster demonstrating superficial nature of lymphatics as reason for gentle stretch and not heavy pressure.     PATIENT EDUCATION:  Education details: MLD, compression, garment options and bring with her what she has Person educated: Patient Education method: Medical Illustrator Education comprehension: verbalized understanding and needs further education  HOME EXERCISE PROGRAM:   ASSESSMENT:  CLINICAL IMPRESSION:   Therapist talked pt through steps with  pt performing MLD. Modified several areas slightly, and pt required only occasional VC's. Also pt messaged MD to get a script for compression sleeve, and was also given a written script for MD to sign prn. Pt will return in several weeks after practicing and hopefully will have her sleeve that I can check.  EVAL Patient is a 59 y.o. female who was seen today for physical therapy evaluation and treatment for complaints of mild  left axillary pain and swelling in the axilla and lateral trunk.  Pt is s/p bilateral Mastectomies in 2012 with Left SLNB and bilateral Lat flap Reconstruction with implants in 2013. In September she noticed increase in axillary and lateral trunk swelling, which benefited from lymphatic massage techniques of a Massage therapist. We discussed benefits of MLD and differences between what a massage therapist might do and a PT trained in MLD. We also discussed compression and pt has purchased several things and will bring them in with her so we can assess for potential benefit. She will benefit from skilled PT to instruct pt in Manual lymphatic drainage techniques and assess her compression. Pt will also watch ABC video so all questions may be answered with the goal of pt being independent in her self care.   OBJECTIVE IMPAIRMENTS: decreased knowledge of condition, increased edema, impaired sensation, postural dysfunction, and pain.   ACTIVITY LIMITATIONS: reaching behind back, certain motions create mild pain in left axilla. Not presently having  PARTICIPATION LIMITATIONS: no real restrictions, avoids exs passing wt behind back  PERSONAL FACTORS: 1-2 comorbidities: bilateral mastectomies s/p lat flaps with implant, Left SLNB are also affecting patient's functional outcome.   REHAB POTENTIAL: Good  CLINICAL DECISION MAKING: Stable/uncomplicated  EVALUATION COMPLEXITY: Low  GOALS: Goals reviewed with patient? Yes  SHORT TERM GOALS=LONG TERM GOALS: Target date: 05/05/2023  Pt  will notice improvements in swelling with MLD techniques Baseline: Goal status: INITIAL  2.  Pt will be educated in and compliant with MLD techniques to decrease left axillary /lateral trunk and upper arm swelling Baseline:  Goal status: INITIAL  3.  Pt will be educated in options for compression garments, or present garments will be modified prn to make them proficient Baseline:  Goal status: INITIAL  4.  Pt will purchase compression sleeve prn for continued gym workouts Baseline:  Goal status: INITIAL  5.  Pt will have all questions about lymphedema answered Baseline:  Goal status: INITIAL  PLAN:  PT FREQUENCY: 1-2x/week  PT DURATION: other: 5 weeks  PLANNED INTERVENTIONS: 97164- PT Re-evaluation, 97110-Therapeutic exercises, 97530- Therapeutic activity, [id number]- Neuromuscular re-education, 97535- Self Care, [id number]- Manual therapy, 97760- Orthotic Initial, [phone]- Orthotic/Prosthetic subsequent, and Patient/Family education  PLAN FOR NEXT SESSION: check goals,give  ABC video info for her to watch, ,, MLD and review with pt. Again, compression sleeve:did she get, likely DC next.  Grayce JINNY Sheldon, PT 04/18/2024, 9:55 AM  "

## 2024-04-24 ENCOUNTER — Ambulatory Visit

## 2024-04-27 ENCOUNTER — Ambulatory Visit

## 2024-05-08 ENCOUNTER — Ambulatory Visit
